# Patient Record
Sex: Female | Born: 1985 | Hispanic: Yes | Marital: Married | Smoking: Former smoker
Health system: Southern US, Community
[De-identification: ages and names within clinical notes are randomized; demographics above are authoritative.]

## PROBLEM LIST (undated history)

## (undated) DIAGNOSIS — J45909 Unspecified asthma, uncomplicated: Secondary | ICD-10-CM

## (undated) DIAGNOSIS — N83209 Unspecified ovarian cyst, unspecified side: Secondary | ICD-10-CM

## (undated) DIAGNOSIS — M722 Plantar fascial fibromatosis: Secondary | ICD-10-CM

## (undated) DIAGNOSIS — K8020 Calculus of gallbladder without cholecystitis without obstruction: Secondary | ICD-10-CM

## (undated) DIAGNOSIS — F419 Anxiety disorder, unspecified: Secondary | ICD-10-CM

## (undated) DIAGNOSIS — K819 Cholecystitis, unspecified: Secondary | ICD-10-CM

## (undated) DIAGNOSIS — Z9049 Acquired absence of other specified parts of digestive tract: Secondary | ICD-10-CM

## (undated) DIAGNOSIS — R87629 Unspecified abnormal cytological findings in specimens from vagina: Secondary | ICD-10-CM

## (undated) DIAGNOSIS — O24419 Gestational diabetes mellitus in pregnancy, unspecified control: Secondary | ICD-10-CM

## (undated) HISTORY — DX: Gestational diabetes mellitus in pregnancy, unspecified control: O24.419

## (undated) HISTORY — PX: CERVICAL BIOPSY  W/ LOOP ELECTRODE EXCISION: SUR135

## (undated) HISTORY — DX: Plantar fascial fibromatosis: M72.2

---

## 2014-07-10 DIAGNOSIS — K819 Cholecystitis, unspecified: Secondary | ICD-10-CM

## 2014-07-10 DIAGNOSIS — K8020 Calculus of gallbladder without cholecystitis without obstruction: Secondary | ICD-10-CM

## 2014-07-10 HISTORY — DX: Calculus of gallbladder without cholecystitis without obstruction: K80.20

## 2014-07-10 HISTORY — DX: Cholecystitis, unspecified: K81.9

## 2015-03-15 ENCOUNTER — Inpatient Hospital Stay (HOSPITAL_COMMUNITY)
Admission: EM | Admit: 2015-03-15 | Discharge: 2015-03-19 | DRG: 415 | Disposition: A | Discharge status: Home / Self Care | Payer: Self-pay | Attending: General Surgery | Admitting: General Surgery

## 2015-03-15 ENCOUNTER — Emergency Department (HOSPITAL_COMMUNITY): Payer: Self-pay

## 2015-03-15 ENCOUNTER — Encounter (HOSPITAL_COMMUNITY): Payer: Self-pay | Admitting: *Deleted

## 2015-03-15 DIAGNOSIS — Z419 Encounter for procedure for purposes other than remedying health state, unspecified: Secondary | ICD-10-CM

## 2015-03-15 DIAGNOSIS — Z79899 Other long term (current) drug therapy: Secondary | ICD-10-CM

## 2015-03-15 DIAGNOSIS — K8012 Calculus of gallbladder with acute and chronic cholecystitis without obstruction: Principal | ICD-10-CM | POA: Diagnosis present

## 2015-03-15 DIAGNOSIS — K8000 Calculus of gallbladder with acute cholecystitis without obstruction: Secondary | ICD-10-CM | POA: Diagnosis present

## 2015-03-15 DIAGNOSIS — Z7982 Long term (current) use of aspirin: Secondary | ICD-10-CM

## 2015-03-15 DIAGNOSIS — Y838 Other surgical procedures as the cause of abnormal reaction of the patient, or of later complication, without mention of misadventure at the time of the procedure: Secondary | ICD-10-CM | POA: Diagnosis not present

## 2015-03-15 DIAGNOSIS — K829 Disease of gallbladder, unspecified: Secondary | ICD-10-CM

## 2015-03-15 DIAGNOSIS — K913 Postprocedural intestinal obstruction: Secondary | ICD-10-CM | POA: Diagnosis not present

## 2015-03-15 DIAGNOSIS — K819 Cholecystitis, unspecified: Secondary | ICD-10-CM

## 2015-03-15 LAB — COMPREHENSIVE METABOLIC PANEL
ALBUMIN: 4.6 g/dL (ref 3.5–5.0)
ALK PHOS: 99 U/L (ref 38–126)
ALT: 22 U/L (ref 14–54)
ANION GAP: 8 (ref 5–15)
AST: 20 U/L (ref 15–41)
BILIRUBIN TOTAL: 0.8 mg/dL (ref 0.3–1.2)
BUN: 10 mg/dL (ref 6–20)
CALCIUM: 9.6 mg/dL (ref 8.9–10.3)
CO2: 26 mmol/L (ref 22–32)
Chloride: 107 mmol/L (ref 101–111)
Creatinine, Ser: 0.7 mg/dL (ref 0.44–1.00)
GLUCOSE: 115 mg/dL — AB (ref 65–99)
POTASSIUM: 3.5 mmol/L (ref 3.5–5.1)
Sodium: 141 mmol/L (ref 135–145)
TOTAL PROTEIN: 8.5 g/dL — AB (ref 6.5–8.1)

## 2015-03-15 LAB — URINALYSIS, ROUTINE W REFLEX MICROSCOPIC
Bilirubin Urine: NEGATIVE
Glucose, UA: NEGATIVE mg/dL
Hgb urine dipstick: NEGATIVE
Ketones, ur: NEGATIVE mg/dL
Nitrite: NEGATIVE
Protein, ur: NEGATIVE mg/dL
Specific Gravity, Urine: 1.028 (ref 1.005–1.030)
Urobilinogen, UA: 1 mg/dL (ref 0.0–1.0)
pH: 5.5 (ref 5.0–8.0)

## 2015-03-15 LAB — URINE MICROSCOPIC-ADD ON

## 2015-03-15 LAB — POC URINE PREG, ED: Preg Test, Ur: NEGATIVE

## 2015-03-15 LAB — CBC
HCT: 40.3 % (ref 36.0–46.0)
Hemoglobin: 13.6 g/dL (ref 12.0–15.0)
MCH: 29.5 pg (ref 26.0–34.0)
MCHC: 33.7 g/dL (ref 30.0–36.0)
MCV: 87.4 fL (ref 78.0–100.0)
Platelets: 341 K/uL (ref 150–400)
RBC: 4.61 MIL/uL (ref 3.87–5.11)
RDW: 12.3 % (ref 11.5–15.5)
WBC: 11.2 K/uL — ABNORMAL HIGH (ref 4.0–10.5)

## 2015-03-15 LAB — SURGICAL PCR SCREEN
MRSA, PCR: NEGATIVE
Staphylococcus aureus: NEGATIVE

## 2015-03-15 LAB — LIPASE, BLOOD: Lipase: 19 U/L — ABNORMAL LOW (ref 22–51)

## 2015-03-15 MED ORDER — HYDROMORPHONE HCL 1 MG/ML IJ SOLN
1.0000 mg | INTRAMUSCULAR | Status: DC | PRN
  Administered 2015-03-15 – 2015-03-16 (×5): 1 mg via INTRAVENOUS
  Filled 2015-03-15 (×5): qty 1

## 2015-03-15 MED ORDER — HYDROCODONE-ACETAMINOPHEN 5-325 MG PO TABS: 1.0000 | ORAL_TABLET | ORAL | Status: DC | PRN

## 2015-03-15 MED ORDER — ACETAMINOPHEN 325 MG PO TABS
650.0000 mg | ORAL_TABLET | Freq: Four times a day (QID) | ORAL | Status: DC | PRN
  Administered 2015-03-17: 650 mg via ORAL
  Filled 2015-03-15: qty 2

## 2015-03-15 MED ORDER — HYDROMORPHONE HCL 1 MG/ML IJ SOLN: 1.0000 mg | Freq: Once | INTRAMUSCULAR | Status: DC

## 2015-03-15 MED ORDER — PROCHLORPERAZINE EDISYLATE 5 MG/ML IJ SOLN
10.0000 mg | Freq: Once | INTRAMUSCULAR | Status: AC
  Administered 2015-03-15: 10 mg via INTRAVENOUS
  Filled 2015-03-15: qty 2

## 2015-03-15 MED ORDER — ACETAMINOPHEN 650 MG RE SUPP: 650.0000 mg | Freq: Four times a day (QID) | RECTAL | Status: DC | PRN

## 2015-03-15 MED ORDER — HYDROMORPHONE HCL 1 MG/ML IJ SOLN
0.5000 mg | Freq: Once | INTRAMUSCULAR | Status: AC
  Administered 2015-03-15: 0.5 mg via INTRAVENOUS
  Filled 2015-03-15: qty 1

## 2015-03-15 MED ORDER — ONDANSETRON HCL 4 MG/2ML IJ SOLN
4.0000 mg | Freq: Once | INTRAMUSCULAR | Status: AC
  Administered 2015-03-15: 4 mg via INTRAVENOUS
  Filled 2015-03-15: qty 2

## 2015-03-15 MED ORDER — ONDANSETRON HCL 4 MG/2ML IJ SOLN
4.0000 mg | Freq: Four times a day (QID) | INTRAMUSCULAR | Status: DC | PRN
  Administered 2015-03-16 – 2015-03-18 (×3): 4 mg via INTRAVENOUS
  Filled 2015-03-15 (×3): qty 2

## 2015-03-15 MED ORDER — KCL IN DEXTROSE-NACL 20-5-0.45 MEQ/L-%-% IV SOLN
INTRAVENOUS | Status: DC
  Administered 2015-03-15: 18:00:00 via INTRAVENOUS
  Administered 2015-03-16: 1000 mL via INTRAVENOUS
  Filled 2015-03-15 (×3): qty 1000

## 2015-03-15 MED ORDER — ONDANSETRON 4 MG PO TBDP
4.0000 mg | ORAL_TABLET | Freq: Four times a day (QID) | ORAL | Status: DC | PRN
  Administered 2015-03-19: 4 mg via ORAL
  Filled 2015-03-15: qty 1

## 2015-03-15 NOTE — ED Notes (Signed)
Pt reports her pcp told her she had gall stones 1 year ago, never got issue fixed. Pt started having abd pain,back pain, vomiting x3 days. Pain 8/10. Denies blood in  Urine or stool.

## 2015-03-15 NOTE — H&P (Signed)
Lori Donaldson is an 29 y.o. female.    General Surgery [facility]  Chief Complaint: abdominal pain, cholecystitis, cholelithiasis  HPI: patient is a 29 yo Hispanic female with two year hx of intermittent abdominal pain.  Diagnosed over one year ago with symptomatic cholelithiasis.  Seen by surgery but unable to proceed due to financial concerns.  Continued intermittent pain approx twice per month.  Now with onset pain 48 hours ago which unrelenting after eating pizza and steak.  Nausea and emesis and dry heaves.  No fever or chills.  Denies jaundice or acholic stools.  No prior adominal surgery.  History reviewed. No pertinent past medical history.  History reviewed. No pertinent past surgical history.  History reviewed. No pertinent family history. Social History:  reports that she has never smoked. She does not have any smokeless tobacco history on file. She reports that she does not drink alcohol or use illicit drugs.  Allergies: No Known Allergies   (Not in a hospital admission)  Results for orders placed or performed during the hospital encounter of 03/15/15 (from the past 48 hour(s))  Lipase, blood     Status: Abnormal   Collection Time: 03/15/15 11:46 AM  Result Value Ref Range   Lipase 19 (L) 22 - 51 U/L  Comprehensive metabolic panel     Status: Abnormal   Collection Time: 03/15/15 11:46 AM  Result Value Ref Range   Sodium 141 135 - 145 mmol/L   Potassium 3.5 3.5 - 5.1 mmol/L   Chloride 107 101 - 111 mmol/L   CO2 26 22 - 32 mmol/L   Glucose, Bld 115 (H) 65 - 99 mg/dL   BUN 10 6 - 20 mg/dL   Creatinine, Ser 8.62 0.44 - 1.00 mg/dL   Calcium 9.6 8.9 - 76.5 mg/dL   Total Protein 8.5 (H) 6.5 - 8.1 g/dL   Albumin 4.6 3.5 - 5.0 g/dL   AST 20 15 - 41 U/L   ALT 22 14 - 54 U/L   Alkaline Phosphatase 99 38 - 126 U/L   Total Bilirubin 0.8 0.3 - 1.2 mg/dL   GFR calc non Af Amer >60 >60 mL/min   GFR calc Af Amer >60 >60 mL/min    Comment: (NOTE) The  eGFR has been calculated using the CKD EPI equation. This calculation has not been validated in all clinical situations. eGFR's persistently <60 mL/min signify possible Chronic Kidney Disease.    Anion gap 8 5 - 15  CBC     Status: Abnormal   Collection Time: 03/15/15 11:46 AM  Result Value Ref Range   WBC 11.2 (H) 4.0 - 10.5 K/uL   RBC 4.61 3.87 - 5.11 MIL/uL   Hemoglobin 13.6 12.0 - 15.0 g/dL   HCT 99.4 20.2 - 72.4 %   MCV 87.4 78.0 - 100.0 fL   MCH 29.5 26.0 - 34.0 pg   MCHC 33.7 30.0 - 36.0 g/dL   RDW 15.0 66.2 - 86.3 %   Platelets 341 150 - 400 K/uL  Urinalysis, Routine w reflex microscopic (not at [facility])     Status: Abnormal   Collection Time: 03/15/15 12:00 PM  Result Value Ref Range   Color, Urine AMBER (A) YELLOW    Comment: BIOCHEMICALS MAY BE AFFECTED BY COLOR   APPearance CLOUDY (A) CLEAR   Specific Gravity, Urine 1.028 1.005 - 1.030   pH 5.5 5.0 - 8.0   Glucose, UA NEGATIVE NEGATIVE mg/dL   Hgb urine dipstick NEGATIVE NEGATIVE  Bilirubin Urine NEGATIVE NEGATIVE   Ketones, ur NEGATIVE NEGATIVE mg/dL   Protein, ur NEGATIVE NEGATIVE mg/dL   Urobilinogen, UA 1.0 0.0 - 1.0 mg/dL   Nitrite NEGATIVE NEGATIVE   Leukocytes, UA SMALL (A) NEGATIVE  Urine microscopic-add on     Status: Abnormal   Collection Time: 03/15/15 12:00 PM  Result Value Ref Range   Squamous Epithelial / LPF MANY (A) RARE   WBC, UA 3-6 <3 WBC/hpf   RBC / HPF 0-2 <3 RBC/hpf   Bacteria, UA MANY (A) RARE  POC urine preg, ED (not at [facility])     Status: None   Collection Time: 03/15/15 12:03 PM  Result Value Ref Range   Preg Test, Ur NEGATIVE NEGATIVE    Comment:        THE SENSITIVITY OF THIS METHODOLOGY IS >24 mIU/mL    US Abdomen Complete  03/15/2015   CLINICAL DATA:  Right upper quadrant pain.  EXAM: ULTRASOUND ABDOMEN COMPLETE  COMPARISON:  None.  FINDINGS: Gallbladder: Cholelithiasis with mild gallbladder wall thickening. No pericholecystic fluid. Negative sonographic Murphy sign.  Common bile  duct: Diameter: 1.7 mm  Liver: No focal lesion identified. Within normal limits in parenchymal echogenicity.  IVC: No abnormality visualized.  Pancreas: Limited visualization secondary to overlying bowel gas.  Spleen: Size and appearance within normal limits.  Right Kidney: Length: 11.8 cm. Echogenicity within normal limits. No mass or hydronephrosis visualized.  Left Kidney: Length: 11.1 cm. Echogenicity within normal limits. No mass or hydronephrosis visualized.  Abdominal aorta: No aneurysm visualized.  Other findings: None.  IMPRESSION: Cholelithiasis with mild gallbladder wall thickening. The findings are concerning for, but not diagnostic of acute cholecystitis.   Electronically Signed   By: Kathreen Devoid   On: 03/15/2015 16:17    Review of Systems  Constitutional: Negative for fever, chills and diaphoresis.  HENT: Negative.   Eyes: Negative.   Respiratory: Negative.   Cardiovascular: Negative.   Gastrointestinal: Positive for nausea, vomiting and abdominal pain. Negative for diarrhea and constipation.  Genitourinary: Negative.   Musculoskeletal: Positive for back pain.  Skin: Negative.   Neurological: Negative.   Endo/Heme/Allergies: Negative.   Psychiatric/Behavioral: Negative.     Blood pressure 145/90, pulse 68, temperature 98 F (36.7 C), temperature source Oral, resp. rate 18, last menstrual period 02/10/2015, SpO2 99 %. Physical Exam  Constitutional: She is oriented to person, place, and time. She appears well-developed and well-nourished. No distress.  HENT:  Head: Normocephalic and atraumatic.  Right Ear: External ear normal.  Left Ear: External ear normal.  Mouth/Throat: No oropharyngeal exudate.  Eyes: Conjunctivae are normal. Pupils are equal, round, and reactive to light. No scleral icterus.  Neck: Normal range of motion. Neck supple. No tracheal deviation present. No thyromegaly present.  Cardiovascular: Normal rate, regular rhythm and normal heart sounds.   No murmur  heard. Respiratory: Effort normal and breath sounds normal. No respiratory distress. She has no wheezes.  GI: Soft. Bowel sounds are normal. She exhibits no distension and no mass. There is tenderness (mild RUQ). There is no rebound and no guarding.  Musculoskeletal: Normal range of motion. She exhibits no edema.  Neurological: She is alert and oriented to person, place, and time.  Skin: Skin is warm and dry. She is not diaphoretic.  Psychiatric: She has a normal mood and affect. Her behavior is normal.     Assessment/Plan  Subacute cholecystitis, cholelithiasis, unrelenting biliary colic  Admit to general surgery service  IV hydration  Pain control  Rx  nausea  Plan OR (lap chole with IOC) in AM 9/6 by Dr. Fanny Skates  The risks and benefits of the procedure have been discussed at length with the patient.  The patient understands the proposed procedure, potential alternative treatments, and the course of recovery to be expected.  All of the patient's questions have been answered at this time.  The patient wishes to proceed with surgery.  Earnstine Regal, MD, [facility] Office: [place] 03/15/2015, 5:44 PM

## 2015-03-15 NOTE — ED Provider Notes (Signed)
CSN: [id number]     Arrival date & time 03/15/15  1129 History   First MD Initiated Contact with Patient 03/15/15 1435     Chief Complaint  Patient presents with  . Abdominal Pain  . Emesis     (Consider location/radiation/quality/duration/timing/severity/associated sxs/prior Treatment) HPI   Lori Donaldson is a 29 y.o. female presents for evaluation of right upper quadrant pain radiating to her right back, present and constant for 3 days. She has nausea and vomiting. No diarrhea. Prior diagnosis of gallstones, soft, surgeon, for same, 1 year ago, decided not to proceed with removal at that time. No fever, cough or chest pain. No meds tried. There are no other known modifying factors.      History reviewed. No pertinent past medical history. History reviewed. No pertinent past surgical history. History reviewed. No pertinent family history. Social History  Substance Use Topics  . Smoking status: Never Smoker   . Smokeless tobacco: None  . Alcohol Use: No   OB History    No data available     Review of Systems  All other systems reviewed and are negative.     Allergies  Review of patient's allergies indicates no known allergies.  Home Medications   Prior to Admission medications   Medication Sig Start Date End Date Taking? Authorizing Provider  aspirin-acetaminophen-caffeine (EXCEDRIN MIGRAINE) [id number] MG per tablet Take 2 tablets by mouth every 6 (six) hours as needed for headache.   Yes Historical Provider, MD  ibuprofen (ADVIL,MOTRIN) 200 MG tablet Take 400-800 mg by mouth every 6 (six) hours as needed for headache or cramping.   Yes Historical Provider, MD  Menthol, Topical Analgesic, (ICY HOT BACK) 5 % PADS Apply 1 patch topically daily as needed (back pain).   Yes Historical Provider, MD  Menthol-Camphor (TIGER BALM ARTHRITIS RUB EX) Apply 1 application topically 2 (two) times daily as needed (muscle pain).   Yes Historical Provider, MD   BP 126/81 mmHg  Pulse  69  Temp(Src) 98 F (36.7 C) (Oral)  Resp 18  SpO2 98%  LMP 02/10/2015 (Approximate) Physical Exam  Constitutional: She is oriented to person, place, and time. She appears well-developed and well-nourished.  HENT:  Head: Normocephalic and atraumatic.  Right Ear: External ear normal.  Left Ear: External ear normal.  Eyes: Conjunctivae and EOM are normal. Pupils are equal, round, and reactive to light.  Neck: Normal range of motion and phonation normal. Neck supple.  Cardiovascular: Normal rate, regular rhythm and normal heart sounds.   Pulmonary/Chest: Effort normal and breath sounds normal. No respiratory distress. She exhibits no bony tenderness.  Abdominal: Soft. There is tenderness (right upper quadrant, mild.. This examination was performed shortly after she received IV hydromorphone.). There is no rebound.  Genitourinary:  No cost vertebral angle tenderness  Musculoskeletal: Normal range of motion.  Neurological: She is alert and oriented to person, place, and time. No cranial nerve deficit or sensory deficit. She exhibits normal muscle tone. Coordination normal.  Skin: Skin is warm, dry and intact.  Psychiatric: She has a normal mood and affect. Her behavior is normal. Judgment and thought content normal.  Nursing note and vitals reviewed.   ED Course  Procedures (including critical care time)  Medications  HYDROmorphone (DILAUDID) injection 0.5 mg (0.5 mg Intravenous Given 03/15/15 1410)  ondansetron (ZOFRAN) injection 4 mg (4 mg Intravenous Given 03/15/15 1410)    Patient Vitals for the past 24 hrs:  BP Temp Temp src Pulse Resp SpO2  03/15/15 1409  126/81 mmHg - - 69 18 98 %  03/15/15 1136 130/95 mmHg 98 F (36.7 C) Oral 78 16 97 %    4:30 PM Reevaluation with update and discussion. After initial assessment and treatment, an updated evaluation reveals she states that her pain is "coming back" and she appears uncomfortable. Additional medication ordered and surgery will be  contacted for admission. [name]   16:30- consult to Gen. Surgery; Dr. Gerrit Friends will admit  Labs Review Labs Reviewed  LIPASE, BLOOD - Abnormal; Notable for the following:    Lipase 19 (*)    All other components within normal limits  COMPREHENSIVE METABOLIC PANEL - Abnormal; Notable for the following:    Glucose, Bld 115 (*)    Total Protein 8.5 (*)    All other components within normal limits  CBC - Abnormal; Notable for the following:    WBC 11.2 (*)    All other components within normal limits  URINALYSIS, ROUTINE W REFLEX MICROSCOPIC (NOT AT [facility]) - Abnormal; Notable for the following:    Color, Urine AMBER (*)    APPearance CLOUDY (*)    Leukocytes, UA SMALL (*)    All other components within normal limits  URINE MICROSCOPIC-ADD ON - Abnormal; Notable for the following:    Squamous Epithelial / LPF MANY (*)    Bacteria, UA MANY (*)    All other components within normal limits  POC URINE PREG, ED    Imaging Review No results found. I have personally reviewed and evaluated these images and lab results as part of my medical decision-making.   EKG Interpretation None      MDM   Final diagnoses:  Gallbladder disease  Cholecystitis    Gallstones, symptomatic, with signs for cholecystitis, mildly elevated white count and gallbladder wall thickening. No fever currently. Patient remains comfortable after treatment, and will require surgical evaluation in the emergency department.  Nursing Notes Reviewed/ Care Coordinated, and agree without changes. Applicable Imaging Reviewed.  Interpretation of Laboratory Data incorporated into ED treatment  Plan: Admit    Mancel Bale, MD 03/15/15 [phone]

## 2015-03-16 ENCOUNTER — Encounter (HOSPITAL_COMMUNITY): Admission: EM | Disposition: A | Discharge status: Home / Self Care | Payer: Self-pay | Source: Home / Self Care

## 2015-03-16 ENCOUNTER — Observation Stay (HOSPITAL_COMMUNITY): Payer: MEDICAID | Admitting: Anesthesiology

## 2015-03-16 ENCOUNTER — Observation Stay (HOSPITAL_COMMUNITY): Payer: Self-pay | Admitting: Anesthesiology

## 2015-03-16 ENCOUNTER — Observation Stay (HOSPITAL_COMMUNITY): Payer: MEDICAID

## 2015-03-16 ENCOUNTER — Encounter (HOSPITAL_COMMUNITY): Payer: Self-pay

## 2015-03-16 DIAGNOSIS — K8000 Calculus of gallbladder with acute cholecystitis without obstruction: Secondary | ICD-10-CM | POA: Diagnosis present

## 2015-03-16 DIAGNOSIS — Z9049 Acquired absence of other specified parts of digestive tract: Secondary | ICD-10-CM

## 2015-03-16 HISTORY — PX: CHOLECYSTECTOMY: SHX55

## 2015-03-16 HISTORY — DX: Acquired absence of other specified parts of digestive tract: Z90.49

## 2015-03-16 LAB — CBC
HEMATOCRIT: 36.5 % (ref 36.0–46.0)
Hemoglobin: 12 g/dL (ref 12.0–15.0)
MCH: 29.3 pg (ref 26.0–34.0)
MCHC: 32.9 g/dL (ref 30.0–36.0)
MCV: 89 fL (ref 78.0–100.0)
Platelets: 297 10*3/uL (ref 150–400)
RBC: 4.1 MIL/uL (ref 3.87–5.11)
RDW: 12.5 % (ref 11.5–15.5)
WBC: 20.7 10*3/uL — ABNORMAL HIGH (ref 4.0–10.5)

## 2015-03-16 LAB — CREATININE, SERUM
Creatinine, Ser: 0.62 mg/dL (ref 0.44–1.00)
GFR calc Af Amer: >60 mL/min (ref >60)

## 2015-03-16 SURGERY — LAPAROSCOPIC CHOLECYSTECTOMY WITH INTRAOPERATIVE CHOLANGIOGRAM
Anesthesia: General

## 2015-03-16 MED ORDER — FENTANYL CITRATE (PF) 100 MCG/2ML IJ SOLN
INTRAMUSCULAR | Status: DC | PRN
  Administered 2015-03-16 (×3): 100 ug via INTRAVENOUS
  Administered 2015-03-16 (×3): 50 ug via INTRAVENOUS
  Administered 2015-03-16: 25 ug via INTRAVENOUS

## 2015-03-16 MED ORDER — DEXTROSE 5 % IV SOLN
INTRAVENOUS | Status: AC
  Filled 2015-03-16: qty 2

## 2015-03-16 MED ORDER — OXYCODONE-ACETAMINOPHEN 5-325 MG PO TABS
1.0000 | ORAL_TABLET | ORAL | Status: DC | PRN
  Administered 2015-03-17 – 2015-03-19 (×8): 2 via ORAL
  Filled 2015-03-16 (×8): qty 2

## 2015-03-16 MED ORDER — FENTANYL CITRATE (PF) 100 MCG/2ML IJ SOLN
25.0000 ug | INTRAMUSCULAR | Status: DC | PRN
  Administered 2015-03-16 (×2): 50 ug via INTRAVENOUS

## 2015-03-16 MED ORDER — CEFAZOLIN SODIUM-DEXTROSE 2-3 GM-% IV SOLR
INTRAVENOUS | Status: AC
  Filled 2015-03-16: qty 50

## 2015-03-16 MED ORDER — PROMETHAZINE HCL 25 MG/ML IJ SOLN: 6.2500 mg | INTRAMUSCULAR | Status: DC | PRN

## 2015-03-16 MED ORDER — CEFAZOLIN SODIUM-DEXTROSE 2-3 GM-% IV SOLR
2.0000 g | Freq: Three times a day (TID) | INTRAVENOUS | Status: DC
  Administered 2015-03-16: 2 g via INTRAVENOUS
  Filled 2015-03-16 (×2): qty 50

## 2015-03-16 MED ORDER — PROPOFOL 10 MG/ML IV BOLUS
INTRAVENOUS | Status: AC
  Filled 2015-03-16: qty 20

## 2015-03-16 MED ORDER — LIDOCAINE HCL (CARDIAC) 20 MG/ML IV SOLN
INTRAVENOUS | Status: DC | PRN
  Administered 2015-03-16: 75 mg via INTRAVENOUS

## 2015-03-16 MED ORDER — ROCURONIUM BROMIDE 100 MG/10ML IV SOLN
INTRAVENOUS | Status: DC | PRN
  Administered 2015-03-16: 10 mg via INTRAVENOUS
  Administered 2015-03-16: 40 mg via INTRAVENOUS
  Administered 2015-03-16 (×2): 10 mg via INTRAVENOUS

## 2015-03-16 MED ORDER — PANTOPRAZOLE SODIUM 40 MG IV SOLR
40.0000 mg | Freq: Every day | INTRAVENOUS | Status: DC
  Administered 2015-03-16: 40 mg via INTRAVENOUS
  Filled 2015-03-16 (×2): qty 40

## 2015-03-16 MED ORDER — MIDAZOLAM HCL 5 MG/5ML IJ SOLN
INTRAMUSCULAR | Status: DC | PRN
  Administered 2015-03-16 (×2): 1 mg via INTRAVENOUS

## 2015-03-16 MED ORDER — FENTANYL CITRATE (PF) 100 MCG/2ML IJ SOLN
INTRAMUSCULAR | Status: AC
  Filled 2015-03-16: qty 2

## 2015-03-16 MED ORDER — GLYCOPYRROLATE 0.2 MG/ML IJ SOLN
INTRAMUSCULAR | Status: DC | PRN
  Administered 2015-03-16: .6 mg via INTRAVENOUS

## 2015-03-16 MED ORDER — BUPIVACAINE-EPINEPHRINE 0.5% -1:200000 IJ SOLN
INTRAMUSCULAR | Status: DC | PRN
  Administered 2015-03-16: 10 mL

## 2015-03-16 MED ORDER — ONDANSETRON 4 MG PO TBDP: 4.0000 mg | ORAL_TABLET | Freq: Four times a day (QID) | ORAL | Status: DC | PRN

## 2015-03-16 MED ORDER — HYDROMORPHONE HCL 1 MG/ML IJ SOLN
1.0000 mg | INTRAMUSCULAR | Status: DC | PRN
  Administered 2015-03-16 – 2015-03-17 (×3): 1 mg via INTRAVENOUS
  Filled 2015-03-16 (×3): qty 1

## 2015-03-16 MED ORDER — MIDAZOLAM HCL 2 MG/2ML IJ SOLN
INTRAMUSCULAR | Status: AC
  Filled 2015-03-16: qty 4

## 2015-03-16 MED ORDER — FENTANYL CITRATE (PF) 100 MCG/2ML IJ SOLN
INTRAMUSCULAR | Status: AC
  Filled 2015-03-16: qty 4

## 2015-03-16 MED ORDER — MEPERIDINE HCL 50 MG/ML IJ SOLN: 6.2500 mg | INTRAMUSCULAR | Status: DC | PRN

## 2015-03-16 MED ORDER — NEOSTIGMINE METHYLSULFATE 10 MG/10ML IV SOLN
INTRAVENOUS | Status: DC | PRN
  Administered 2015-03-16: 5 mg via INTRAVENOUS

## 2015-03-16 MED ORDER — BUPIVACAINE-EPINEPHRINE 0.5% -1:200000 IJ SOLN
INTRAMUSCULAR | Status: AC
  Filled 2015-03-16: qty 1

## 2015-03-16 MED ORDER — ACETAMINOPHEN 10 MG/ML IV SOLN
INTRAVENOUS | Status: AC
  Filled 2015-03-16: qty 100

## 2015-03-16 MED ORDER — DEXTROSE 5 % IV SOLN
2.0000 g | INTRAVENOUS | Status: DC
  Administered 2015-03-16 – 2015-03-18 (×3): 2 g via INTRAVENOUS
  Filled 2015-03-16 (×3): qty 2

## 2015-03-16 MED ORDER — FENTANYL CITRATE (PF) 100 MCG/2ML IJ SOLN
25.0000 ug | Freq: Once | INTRAMUSCULAR | Status: AC
  Administered 2015-03-16: 25 ug via INTRAVENOUS

## 2015-03-16 MED ORDER — ENOXAPARIN SODIUM 40 MG/0.4ML ~~LOC~~ SOLN
40.0000 mg | SUBCUTANEOUS | Status: DC
  Administered 2015-03-17 – 2015-03-19 (×3): 40 mg via SUBCUTANEOUS
  Filled 2015-03-16 (×5): qty 0.4

## 2015-03-16 MED ORDER — ACETAMINOPHEN 10 MG/ML IV SOLN
1000.0000 mg | Freq: Once | INTRAVENOUS | Status: AC
  Administered 2015-03-16: 1000 mg via INTRAVENOUS

## 2015-03-16 MED ORDER — LACTATED RINGERS IV SOLN: INTRAVENOUS | Status: DC

## 2015-03-16 MED ORDER — FENTANYL CITRATE (PF) 100 MCG/2ML IJ SOLN
50.0000 ug | Freq: Once | INTRAMUSCULAR | Status: AC
  Administered 2015-03-16: 50 ug via INTRAVENOUS

## 2015-03-16 MED ORDER — DEXTROSE 5 % IV SOLN
1.0000 g | Freq: Once | INTRAVENOUS | Status: AC
  Administered 2015-03-16: 2 g via INTRAVENOUS
  Filled 2015-03-16: qty 10

## 2015-03-16 MED ORDER — ONDANSETRON HCL 4 MG/2ML IJ SOLN: 4.0000 mg | Freq: Four times a day (QID) | INTRAMUSCULAR | Status: DC | PRN

## 2015-03-16 MED ORDER — POTASSIUM CHLORIDE IN NACL 20-0.9 MEQ/L-% IV SOLN
INTRAVENOUS | Status: DC
  Administered 2015-03-16 – 2015-03-18 (×6): via INTRAVENOUS
  Administered 2015-03-19: 50 mL/h via INTRAVENOUS
  Filled 2015-03-16 (×9): qty 1000

## 2015-03-16 MED ORDER — DEXAMETHASONE SODIUM PHOSPHATE 10 MG/ML IJ SOLN
INTRAMUSCULAR | Status: DC | PRN
  Administered 2015-03-16: 10 mg via INTRAVENOUS

## 2015-03-16 MED ORDER — ONDANSETRON HCL 4 MG/2ML IJ SOLN
INTRAMUSCULAR | Status: DC | PRN
  Administered 2015-03-16: 4 mg via INTRAVENOUS

## 2015-03-16 MED ORDER — SUCCINYLCHOLINE CHLORIDE 20 MG/ML IJ SOLN
INTRAMUSCULAR | Status: DC | PRN
  Administered 2015-03-16: 120 mg via INTRAVENOUS

## 2015-03-16 MED ORDER — LACTATED RINGERS IV SOLN
INTRAVENOUS | Status: DC
  Administered 2015-03-16 (×2): via INTRAVENOUS
  Administered 2015-03-16: 1000 mL via INTRAVENOUS

## 2015-03-16 MED ORDER — CHLORHEXIDINE GLUCONATE 0.12 % MT SOLN
15.0000 mL | Freq: Two times a day (BID) | OROMUCOSAL | Status: DC
  Administered 2015-03-16 – 2015-03-18 (×5): 15 mL via OROMUCOSAL
  Filled 2015-03-16 (×7): qty 15

## 2015-03-16 MED ORDER — HYDROMORPHONE HCL 1 MG/ML IJ SOLN
INTRAMUSCULAR | Status: DC | PRN
  Administered 2015-03-16 (×4): 0.5 mg via INTRAVENOUS

## 2015-03-16 MED ORDER — PROPOFOL 10 MG/ML IV BOLUS
INTRAVENOUS | Status: DC | PRN
  Administered 2015-03-16: 175 mg via INTRAVENOUS

## 2015-03-16 MED ORDER — FENTANYL CITRATE (PF) 250 MCG/5ML IJ SOLN
INTRAMUSCULAR | Status: AC
  Filled 2015-03-16: qty 25

## 2015-03-16 SURGICAL SUPPLY — 40 items
APPLIER CLIP ROT 10 11.4 M/L (STAPLE) ×3
BENZOIN TINCTURE PRP APPL 2/3 (GAUZE/BANDAGES/DRESSINGS) IMPLANT
CLIP APPLIE ROT 10 11.4 M/L (STAPLE) ×1 IMPLANT
CLOSURE WOUND 1/2 X4 (GAUZE/BANDAGES/DRESSINGS)
COVER MAYO STAND STRL (DRAPES) ×3 IMPLANT
COVER SURGICAL LIGHT HANDLE (MISCELLANEOUS) ×3 IMPLANT
DECANTER SPIKE VIAL GLASS SM (MISCELLANEOUS) ×3 IMPLANT
DRAPE C-ARM 42X120 X-RAY (DRAPES) ×3 IMPLANT
DRAPE LAPAROSCOPIC ABDOMINAL (DRAPES) ×3 IMPLANT
DRSG TELFA 3X8 NADH (GAUZE/BANDAGES/DRESSINGS) ×3 IMPLANT
ELECT PENCIL ROCKER SW 15FT (MISCELLANEOUS) ×3 IMPLANT
ELECT REM PT RETURN 9FT ADLT (ELECTROSURGICAL) ×3
ELECTRODE REM PT RTRN 9FT ADLT (ELECTROSURGICAL) ×1 IMPLANT
ENDOLOOP SUT PDS II  0 18 (SUTURE) ×4
ENDOLOOP SUT PDS II 0 18 (SUTURE) ×2 IMPLANT
GAUZE SPONGE 4X4 12PLY STRL (GAUZE/BANDAGES/DRESSINGS) ×6 IMPLANT
GLOVE EUDERMIC 7 POWDERFREE (GLOVE) ×3 IMPLANT
GOWN STRL REUS W/TWL XL LVL3 (GOWN DISPOSABLE) ×12 IMPLANT
HEMOSTAT SNOW SURGICEL 2X4 (HEMOSTASIS) IMPLANT
KIT BASIN OR (CUSTOM PROCEDURE TRAY) ×3 IMPLANT
LIQUID BAND (GAUZE/BANDAGES/DRESSINGS) ×3 IMPLANT
POUCH SPECIMEN RETRIEVAL 10MM (ENDOMECHANICALS) ×3 IMPLANT
SCISSORS LAP 5X35 DISP (ENDOMECHANICALS) ×3 IMPLANT
SET CHOLANGIOGRAPH MIX (MISCELLANEOUS) ×3 IMPLANT
SET IRRIG TUBING LAPAROSCOPIC (IRRIGATION / IRRIGATOR) ×3 IMPLANT
SLEEVE XCEL OPT CAN 5 100 (ENDOMECHANICALS) ×3 IMPLANT
SPONGE DRAIN TRACH 4X4 STRL 2S (GAUZE/BANDAGES/DRESSINGS) ×3 IMPLANT
STRIP CLOSURE SKIN 1/2X4 (GAUZE/BANDAGES/DRESSINGS) IMPLANT
SUT ETHILON 3 0 PS 1 (SUTURE) ×3 IMPLANT
SUT MNCRL AB 4-0 PS2 18 (SUTURE) ×3 IMPLANT
SUT PDS AB 1 CTX 36 (SUTURE) ×12 IMPLANT
SYR BULB IRRIGATION 50ML (SYRINGE) ×3 IMPLANT
TAPE CLOTH SURG 4X10 WHT LF (GAUZE/BANDAGES/DRESSINGS) ×6 IMPLANT
TOWEL OR 17X26 10 PK STRL BLUE (TOWEL DISPOSABLE) ×3 IMPLANT
TOWEL OR NON WOVEN STRL DISP B (DISPOSABLE) ×3 IMPLANT
TRAY LAPAROSCOPIC (CUSTOM PROCEDURE TRAY) ×3 IMPLANT
TROCAR BLADELESS OPT 5 100 (ENDOMECHANICALS) ×3 IMPLANT
TROCAR XCEL BLUNT TIP 100MML (ENDOMECHANICALS) ×3 IMPLANT
TROCAR XCEL NON-BLD 11X100MML (ENDOMECHANICALS) ×3 IMPLANT
YANKAUER SUCT BULB TIP 10FT TU (MISCELLANEOUS) ×3 IMPLANT

## 2015-03-16 NOTE — H&P (View-Only) (Signed)
  Subjective: Stable.  Awake.  Alert.  Pleasant and cooperative. Says right upper quadrant and right flank pain persist.  Still nauseated. Afebrile.  Heart rate 76.  BP 129/75. Lab work yesterday reveals WBC 11,200, LFTs normal, lipase normal. Ultrasound confirms multiple gallstones, borderline all bladder wall thickening.  CBD 1.7 mm.  Liver healthy.  Objective: Vital signs in last 24 hours: Temp:  [98 F (36.7 C)-98.6 F (37 C)] 98.6 F (37 C) (09/06 0133) Pulse Rate:  [64-78] 76 (09/06 0133) Resp:  [16-18] 17 (09/06 0133) BP: (109-145)/(58-95) 129/75 mmHg (09/06 0133) SpO2:  [96 %-100 %] 96 % (09/06 0133) Weight:  [81.647 kg (180 lb)] 81.647 kg (180 lb) (09/05 2100) Last BM Date: 03/15/15  Intake/Output from previous day: 09/05 0701 - 09/06 0700 In: 873.3 [P.O.:120; I.V.:753.3] Out: 150 [Urine:150] Intake/Output this shift: Total I/O In: 873.3 [P.O.:120; I.V.:753.3] Out: 150 [Urine:150]    EXAM: General appearance: Pleasant.  Does not appear to be in any distress.  Cooperative GI: Tender right upper quadrant.  No mass.  Not distended.  No scars or hernias.  Jewelry at umbilicus which will need to be removed.  Lab Results:   Recent Labs  03/15/15 1146  WBC 11.2*  HGB 13.6  HCT 40.3  PLT 341   BMET  Recent Labs  03/15/15 1146  NA 141  K 3.5  CL 107  CO2 26  GLUCOSE 115*  BUN 10  CREATININE 0.70  CALCIUM 9.6   PT/INR No results for input(s): LABPROT, INR in the last 72 hours. ABG No results for input(s): PHART, HCO3 in the last 72 hours.  Invalid input(s): PCO2, PO2  Studies/Results: Us Abdomen Complete  03/15/2015   CLINICAL DATA:  Right upper quadrant pain.  EXAM: ULTRASOUND ABDOMEN COMPLETE  COMPARISON:  None.  FINDINGS: Gallbladder: Cholelithiasis with mild gallbladder wall thickening. No pericholecystic fluid. Negative sonographic Murphy sign.  Common bile duct: Diameter: 1.7 mm  Liver: No focal lesion identified. Within normal limits in  parenchymal echogenicity.  IVC: No abnormality visualized.  Pancreas: Limited visualization secondary to overlying bowel gas.  Spleen: Size and appearance within normal limits.  Right Kidney: Length: 11.8 cm. Echogenicity within normal limits. No mass or hydronephrosis visualized.  Left Kidney: Length: 11.1 cm. Echogenicity within normal limits. No mass or hydronephrosis visualized.  Abdominal aorta: No aneurysm visualized.  Other findings: None.  IMPRESSION: Cholelithiasis with mild gallbladder wall thickening. The findings are concerning for, but not diagnostic of acute cholecystitis.   Electronically Signed   By: Hetal  Patel   On: 03/15/2015 16:17    Anti-infectives: Anti-infectives    None      Assessment/Plan:  Acute and chronic cholecystitis with cholelithiasis. I discussed cholecystectomy with the patient and she desires to proceed with that surgery today. Continue nothing by mouth and IV fluid hydration, pain control and nausea control.  I discussed the indications, details, techniques, and numerous risk of laparoscopic cholecystectomy with glandular gram, possible open cholecystectomy.  She's aware of the risk of bleeding, infection, conversion to open laparotomy, bile leak, wound hernia, injury to adjacent organs such as the intestine or bile duct with major reconstructive surgery.  She understands all these issues and all of her questions are answered.  She agrees with this plan.  Preop orders are written.      Timoty Bourke M 03/16/2015      

## 2015-03-16 NOTE — Op Note (Signed)
Patient Name:           Lori Donaldson   Date of Surgery:        03/16/2015  Pre op Diagnosis:      Acute cholecystitis with cholelithiasis  Post op Diagnosis:    Same  Procedure:                 Laparoscopic, converted to open cholecystectomy  Surgeon:                     Angelia Mould. Derrell Lolling, M.D., FACS  Assistant:                      Gaynelle Adu, MD and Dillard Essex, M.D.  Operative Indications:   This is a 29 year old Hispanic female with a two-year history of intermittent episodes of right upper quadrant and right flank pain.  She declined surgery in the past.  She's had 2 episodes the last month.  She presented with 48-hour history of unrelenting pain after eating pizza with nausea and emesis.  Liver function test are normal.  White blood cell count was elevated to 11,000.  On exam she has tenderness and guarding in the right upper quadrant.  She was brought to the operating room this morning following admission yesterday by Dr. Gerrit Friends.  Operative Findings:       The gallbladder was extremely inflamed, very thick walled, very edematous.  The duodenum and soft tissues were riding up and complete used during the infundibulum.  I was able to take the dissection down somewhat but during the dissection into the infundibulum and lots of stones spilled out.  I felt the tissues were too friable to handle laparoscopically and converted to an open procedure.  The open procedure was a complete cholecystectomy, with the dissection from the fundus all the way down to the distal cystic duct.  She had several stones in the cystic duct which we milked retrograde and removed.  The liver appeared healthy.  We felt that all stones were removed, and we irrigated and examined all of the operative spaces extensively to be sure that we did not leave any stones behind.  I left a drain in case there was a bile leak, although I do not anticipate that.  Procedure in Detail:         Following the induction of general  endotracheal anesthesia, intravenous antibiotic were given, surgical timeout was performed, 0.5% Marcaine with epinephrine was used as local infiltration as stated.  An 11 mm Hassan trocar was inserted in the infraumbilical site with open technique and secured with the Purstring suture of 0 Vicryl.  Pneumoperitoneum was carried.  Video camera was inserted.  Trochars placed in subxiphoid region and two 5 mm trochars placed in the right upper quadrant.  Suction trocar was used to evacuate bile from the gallbladder but only partially emptied due to numerous stones in the gallbladder.  We're able to lift the gallbladder up somewhat.  We did  find one small  artery going onto the gallbladder which was clipped and divided.  As we were taking the dissection down from the right and the left we entered the infundibulum and some stones came out.  These were retrieved.  Felt that the tissues were too fragile to dissected laparoscopically and I elected to convert to an open operation.       All of the trochars were removed.  The fascia at the umbilicus was closed with  interrupted sutures of 0 Vicryl and Purstring suture of 0 Vicryl.  Ultimately close the umbilical incision with skin staples.     A right subcostal incision was made.  Abdominal wall wall muscles were divided with cautery and the abdomen entered under direct vision.  Self-retaining retractor was placed.  There were no stones visible.  I incised the gallbladder serosa at the tip of the fundus and then slowly dissected it from the top down taking it out of the liver bed.  We will get the dissection down to we could identify the opening in the distal infundibulum.  We continued our dissection sectioning got below this and found that we had about 3 cm the infundibulum below the opening to evaluate down to the cystic duct.  We amputated the gallbladder at the level of the distal infundibulum and removed it from the operative field.  We milked a few remaining stones  out of the cystic duct and divided the cystic duct leaving about a 2-1/2 cm length.  I then closed the cystic duct with 2 PDS Endoloops and this looked good.  Both Dr. Andrey Campanile and Dr. Abbey Chatters were comfortable with the anatomy, as was I.  \    We copiously irrigated and inspected the subphrenic space, subhepatic space, right paracolic gutter.  It does not seem to be any residual stones.  There is no bile leaking.  Minimal bleeding had been controlled.  We placed a 76 French Blake drain in the Hepatic Space up to the Level of the Cystic Duct Closure Brought It out through One of the Trocar Sites in the Right Flank and Connected to Suction Bulb after Suturing to the Skin.  The Posterior Rectus Sheath, Transversus Abdominis and Internal Oblique Were Closed with Running Sutures of #1 PDS.  The Anterior Rectus Sheath and External Oblique Were Closed with a Running Suture of #1 PDS.  Skin Was Closed with Skin Staples and We Placed Telfa Wicks in between the Staples for Drainage.  Clean bandages were placed the patient taken to PACU in stable condition.  EBL 100 mL.  Counts correct.  Complications none.          Angelia Mould. Derrell Lolling, M.D., FACS General and Minimally Invasive Surgery Breast and Colorectal Surgery  03/16/2015 1:01 PM

## 2015-03-16 NOTE — Progress Notes (Signed)
  Subjective: Stable.  Awake.  Alert.  Pleasant and cooperative. Says right upper quadrant and right flank pain persist.  Still nauseated. Afebrile.  Heart rate 76.  BP 129/75. Lab work yesterday reveals WBC 11,200, LFTs normal, lipase normal. Ultrasound confirms multiple gallstones, borderline all bladder wall thickening.  CBD 1.7 mm.  Liver healthy.  Objective: Vital signs in last 24 hours: Temp:  [98 F (36.7 C)-98.6 F (37 C)] 98.6 F (37 C) (09/06 0133) Pulse Rate:  [64-78] 76 (09/06 0133) Resp:  [16-18] 17 (09/06 0133) BP: (109-145)/(58-95) 129/75 mmHg (09/06 0133) SpO2:  [96 %-100 %] 96 % (09/06 0133) Weight:  [81.647 kg (180 lb)] 81.647 kg (180 lb) (09/05 2100) Last BM Date: 03/15/15  Intake/Output from previous day: 09/05 0701 - 09/06 0700 In: 873.3 [P.O.:120; I.V.:753.3] Out: 150 [Urine:150] Intake/Output this shift: Total I/O In: 873.3 [P.O.:120; I.V.:753.3] Out: 150 [Urine:150]    EXAM: General appearance: Pleasant.  Does not appear to be in any distress.  Cooperative GI: Tender right upper quadrant.  No mass.  Not distended.  No scars or hernias.  Jewelry at umbilicus which will need to be removed.  Lab Results:   Recent Labs  03/15/15 1146  WBC 11.2*  HGB 13.6  HCT 40.3  PLT 341   BMET  Recent Labs  03/15/15 1146  NA 141  K 3.5  CL 107  CO2 26  GLUCOSE 115*  BUN 10  CREATININE 0.70  CALCIUM 9.6   PT/INR No results for input(s): LABPROT, INR in the last 72 hours. ABG No results for input(s): PHART, HCO3 in the last 72 hours.  Invalid input(s): PCO2, PO2  Studies/Results: US Abdomen Complete  03/15/2015   CLINICAL DATA:  Right upper quadrant pain.  EXAM: ULTRASOUND ABDOMEN COMPLETE  COMPARISON:  None.  FINDINGS: Gallbladder: Cholelithiasis with mild gallbladder wall thickening. No pericholecystic fluid. Negative sonographic Murphy sign.  Common bile duct: Diameter: 1.7 mm  Liver: No focal lesion identified. Within normal limits in  parenchymal echogenicity.  IVC: No abnormality visualized.  Pancreas: Limited visualization secondary to overlying bowel gas.  Spleen: Size and appearance within normal limits.  Right Kidney: Length: 11.8 cm. Echogenicity within normal limits. No mass or hydronephrosis visualized.  Left Kidney: Length: 11.1 cm. Echogenicity within normal limits. No mass or hydronephrosis visualized.  Abdominal aorta: No aneurysm visualized.  Other findings: None.  IMPRESSION: Cholelithiasis with mild gallbladder wall thickening. The findings are concerning for, but not diagnostic of acute cholecystitis.   Electronically Signed   By: Elige Ko   On: 03/15/2015 16:17    Anti-infectives: Anti-infectives    None      Assessment/Plan:  Acute and chronic cholecystitis with cholelithiasis. I discussed cholecystectomy with the patient and she desires to proceed with that surgery today. Continue nothing by mouth and IV fluid hydration, pain control and nausea control.  I discussed the indications, details, techniques, and numerous risk of laparoscopic cholecystectomy with glandular gram, possible open cholecystectomy.  She's aware of the risk of bleeding, infection, conversion to open laparotomy, bile leak, wound hernia, injury to adjacent organs such as the intestine or bile duct with major reconstructive surgery.  She understands all these issues and all of her questions are answered.  She agrees with this plan.  Preop orders are written.      Ernestene Mention 03/16/2015

## 2015-03-16 NOTE — Progress Notes (Signed)
Removed patients belly button ring using sterile pliers due to the fact it could not be unscrewed. Patient stated it had been in there for 6 years and had never been taken out.  Patient verbalized that I could use the pliers to remove the ring.  Belly Button Ring removed without complications and area cleaned with alcohol wipe after removal prior to being taken back for surgery.

## 2015-03-16 NOTE — Transfer of Care (Signed)
Immediate Anesthesia Transfer of Care Note  Patient: Lori Donaldson  Procedure(s) Performed: Procedure(s): LAPAROSCOPIC CONVERTED TO OPEN CHOLECYSTECTOMY (N/A)  Patient Location: PACU  Anesthesia Type:General  Level of Consciousness: awake, oriented, patient cooperative, lethargic and responds to stimulation  Airway & Oxygen Therapy: Patient Spontanous Breathing and Patient connected to face mask oxygen  Post-op Assessment: Report given to RN, Post -op Vital signs reviewed and stable and Patient moving all extremities  Post vital signs: Reviewed and stable  Last Vitals:  Filed Vitals:   03/16/15 1318  BP:   Pulse: 110  Temp:   Resp:     Complications: No apparent anesthesia complications

## 2015-03-16 NOTE — Interval H&P Note (Signed)
History and Physical Interval Note:  03/16/2015 9:55 AM  Lori Donaldson  has presented today for surgery, with the diagnosis of cholecystitis  The various methods of treatment have been discussed with the patient and family. After consideration of risks, benefits and other options for treatment, the patient has consented to  Procedure(s): LAPAROSCOPIC CHOLECYSTECTOMY WITH INTRAOPERATIVE CHOLANGIOGRAM (N/A) as a surgical intervention .  The patient's history has been reviewed, patient examined, no change in status, stable for surgery.  I have reviewed the patient's chart and labs.  Questions were answered to the patient's satisfaction.     Ernestene Mention

## 2015-03-16 NOTE — Anesthesia Procedure Notes (Signed)
Procedure Name: Intubation Date/Time: 03/16/2015 11:10 AM Performed by: Edison Pace Pre-anesthesia Checklist: Patient identified, Timeout performed, Emergency Drugs available, Suction available and Patient being monitored Patient Re-evaluated:Patient Re-evaluated prior to inductionOxygen Delivery Method: Circle system utilized Preoxygenation: Pre-oxygenation with 100% oxygen Intubation Type: Cricoid Pressure applied Laryngoscope Size: Mac and 3 Grade View: Grade I Tube type: Oral Tube size: 7.5 mm Number of attempts: 1 Airway Equipment and Method: Stylet Placement Confirmation: positive ETCO2,  ETT inserted through vocal cords under direct vision and breath sounds checked- equal and bilateral Secured at: 21 cm Tube secured with: Tape Dental Injury: Teeth and Oropharynx as per pre-operative assessment  Comments: On DL noted to have green fluid floating in posterior pharynx.   cords clear.  Cricoid pressure applied, immediately intubated.  ett suctioned, nothing suctioned out.  O2 sat stable.  Mild expiratory wheezing after DL.  Albuterol MDI via ETT X 5, lungs clear after 5 mins.  Surgeon informed.

## 2015-03-16 NOTE — Anesthesia Preprocedure Evaluation (Addendum)
Anesthesia Evaluation  Patient identified by MRN, date of birth, ID band Patient awake    Reviewed: Allergy & Precautions, NPO status , Patient's Chart, lab work & pertinent test results  Airway Mallampati: II  TM Distance: >3 FB Neck ROM: Full    Dental no notable dental hx.    Pulmonary neg pulmonary ROS,  breath sounds clear to auscultation  Pulmonary exam normal       Cardiovascular negative cardio ROS Normal cardiovascular examRhythm:Regular Rate:Normal     Neuro/Psych negative neurological ROS  negative psych ROS   GI/Hepatic negative GI ROS, Neg liver ROS,   Endo/Other  negative endocrine ROS  Renal/GU negative Renal ROS  negative genitourinary   Musculoskeletal negative musculoskeletal ROS (+)   Abdominal   Peds negative pediatric ROS (+)  Hematology negative hematology ROS (+)   Anesthesia Other Findings   Reproductive/Obstetrics negative OB ROS                             Anesthesia Physical Anesthesia Plan  ASA: II  Anesthesia Plan: General   Post-op Pain Management:    Induction: Intravenous  Airway Management Planned: Oral ETT  Additional Equipment:   Intra-op Plan:   Post-operative Plan: Extubation in OR  Informed Consent: I have reviewed the patients History and Physical, chart, labs and discussed the procedure including the risks, benefits and alternatives for the proposed anesthesia with the patient or authorized representative who has indicated his/her understanding and acceptance.   Dental advisory given  Plan Discussed with: CRNA  Anesthesia Plan Comments:         Anesthesia Quick Evaluation  

## 2015-03-16 NOTE — Anesthesia Postprocedure Evaluation (Signed)
  Anesthesia Post-op Note  Patient: Lori Donaldson  Procedure(s) Performed: Procedure(s) (LRB): LAPAROSCOPIC CONVERTED TO OPEN CHOLECYSTECTOMY (N/A)  Patient Location: PACU  Anesthesia Type: General  Level of Consciousness: awake and alert   Airway and Oxygen Therapy: Patient Spontanous Breathing  Post-op Pain: mild  Post-op Assessment: Post-op Vital signs reviewed, Patient's Cardiovascular Status Stable, Respiratory Function Stable, Patent Airway and No signs of Nausea or vomiting  Last Vitals:  Filed Vitals:   03/16/15 1709  BP: 119/65  Pulse: 85  Temp: 36.9 C  Resp: 16    Post-op Vital Signs: stable   Complications: No apparent anesthesia complications

## 2015-03-17 LAB — COMPREHENSIVE METABOLIC PANEL
ALBUMIN: 3.2 g/dL — AB (ref 3.5–5.0)
ALK PHOS: 68 U/L (ref 38–126)
ALT: 55 U/L — AB (ref 14–54)
ANION GAP: 5 (ref 5–15)
AST: 61 U/L — ABNORMAL HIGH (ref 15–41)
BILIRUBIN TOTAL: 0.6 mg/dL (ref 0.3–1.2)
BUN: 7 mg/dL (ref 6–20)
CALCIUM: 8.5 mg/dL — AB (ref 8.9–10.3)
CO2: 26 mmol/L (ref 22–32)
CREATININE: 0.54 mg/dL (ref 0.44–1.00)
Chloride: 104 mmol/L (ref 101–111)
GFR calc non Af Amer: >60 mL/min (ref >60)
GLUCOSE: 116 mg/dL — AB (ref 65–99)
Potassium: 4.3 mmol/L (ref 3.5–5.1)
SODIUM: 135 mmol/L (ref 135–145)
TOTAL PROTEIN: 6.7 g/dL (ref 6.5–8.1)

## 2015-03-17 LAB — CBC
HCT: 32.4 % — ABNORMAL LOW (ref 36.0–46.0)
Hemoglobin: 10.5 g/dL — ABNORMAL LOW (ref 12.0–15.0)
MCH: 29 pg (ref 26.0–34.0)
MCHC: 32.4 g/dL (ref 30.0–36.0)
MCV: 89.5 fL (ref 78.0–100.0)
PLATELETS: 269 10*3/uL (ref 150–400)
RBC: 3.62 MIL/uL — ABNORMAL LOW (ref 3.87–5.11)
RDW: 12.5 % (ref 11.5–15.5)
WBC: 16.8 10*3/uL — ABNORMAL HIGH (ref 4.0–10.5)

## 2015-03-17 MED ORDER — PANTOPRAZOLE SODIUM 40 MG PO TBEC
40.0000 mg | DELAYED_RELEASE_TABLET | Freq: Every day | ORAL | Status: DC
  Administered 2015-03-17 – 2015-03-19 (×3): 40 mg via ORAL
  Filled 2015-03-17 (×3): qty 1

## 2015-03-17 NOTE — Progress Notes (Signed)
[facility] Progress Note  1 Day Post-Op  Subjective: Pt doing well, no N/V, minimal abdominal pain.  Ambulated some OOB.  Hasn't been able to urinate, I&O cath twice, pending bladder scan in a few hours.  Mother at bedside.    Objective: Vital signs in last 24 hours: Temp:  [98 F (36.7 C)-99.5 F (37.5 C)] 99.2 F (37.3 C) (09/07 0518) Pulse Rate:  [64-112] 64 (09/07 0518) Resp:  [11-22] 16 (09/07 0518) BP: (114-148)/(61-87) 121/67 mmHg (09/07 0518) SpO2:  [94 %-100 %] 99 % (09/07 0518) Last BM Date: 03/15/15  Intake/Output from previous day: 09/06 0701 - 09/07 0700 In: 4200 [I.V.:4200] Out: 2700 [Urine:2500; Drains:100] Intake/Output this shift:    PE: Gen:  Alert, NAD, pleasant Card:  RRR, no M/G/R heard Pulm:  CTA, no W/R/R Abd: Soft, mild tenderness, mild distension, +BS, no HSM, large RUQ incision with sanguinous drainage on bandage, drain with sanguinous drainage   Lab Results:   Recent Labs  03/16/15 1540 03/17/15 0437  WBC 20.7* 16.8*  HGB 12.0 10.5*  HCT 36.5 32.4*  PLT 297 269   BMET  Recent Labs  03/15/15 1146 03/16/15 1540 03/17/15 0437  NA 141  --  135  K 3.5  --  4.3  CL 107  --  104  CO2 26  --  26  GLUCOSE 115*  --  116*  BUN 10  --  7  CREATININE 0.70 0.62 0.54  CALCIUM 9.6  --  8.5*   PT/INR No results for input(s): LABPROT, INR in the last 72 hours. CMP     Component Value Date/Time   NA 135 03/17/2015 0437   K 4.3 03/17/2015 0437   CL 104 03/17/2015 0437   CO2 26 03/17/2015 0437   GLUCOSE 116* 03/17/2015 0437   BUN 7 03/17/2015 0437   CREATININE 0.54 03/17/2015 0437   CALCIUM 8.5* 03/17/2015 0437   PROT 6.7 03/17/2015 0437   ALBUMIN 3.2* 03/17/2015 0437   AST 61* 03/17/2015 0437   ALT 55* 03/17/2015 0437   ALKPHOS 68 03/17/2015 0437   BILITOT 0.6 03/17/2015 0437   GFRNONAA >60 03/17/2015 0437   GFRAA >60 03/17/2015 0437   Lipase     Component Value Date/Time   LIPASE 19* 03/15/2015 1146        Studies/Results: US Abdomen Complete  03/15/2015   CLINICAL DATA:  Right upper quadrant pain.  EXAM: ULTRASOUND ABDOMEN COMPLETE  COMPARISON:  None.  FINDINGS: Gallbladder: Cholelithiasis with mild gallbladder wall thickening. No pericholecystic fluid. Negative sonographic Murphy sign.  Common bile duct: Diameter: 1.7 mm  Liver: No focal lesion identified. Within normal limits in parenchymal echogenicity.  IVC: No abnormality visualized.  Pancreas: Limited visualization secondary to overlying bowel gas.  Spleen: Size and appearance within normal limits.  Right Kidney: Length: 11.8 cm. Echogenicity within normal limits. No mass or hydronephrosis visualized.  Left Kidney: Length: 11.1 cm. Echogenicity within normal limits. No mass or hydronephrosis visualized.  Abdominal aorta: No aneurysm visualized.  Other findings: None.  IMPRESSION: Cholelithiasis with mild gallbladder wall thickening. The findings are concerning for, but not diagnostic of acute cholecystitis.   Electronically Signed   By: Elige Ko   On: 03/15/2015 16:17    Anti-infectives: Anti-infectives    Start     Dose/Rate Route Frequency Ordered Stop   03/16/15 2200  cefTRIAXone (ROCEPHIN) 2 g in dextrose 5 % 50 mL IVPB     2 g 100 mL/hr over 30 Minutes Intravenous Every  24 hours 03/16/15 1507     03/16/15 1300  cefTRIAXone (ROCEPHIN) 1 g in dextrose 5 % 50 mL IVPB     1 g 120 mL/hr over 30 Minutes Intravenous  Once 03/16/15 1225 03/16/15 1215   03/16/15 0615  ceFAZolin (ANCEF) IVPB 2 g/50 mL premix  Status:  Discontinued     2 g 100 mL/hr over 30 Minutes Intravenous 3 times per day 03/16/15 0604 03/16/15 1507       Assessment/Plan Acute cholecystitis with cholelithiasis POD #1 s/p Laparoscopic, converted to open cholecystectomy -Advance to clears, fulls at dinner -LFTS okay this am, less concern for CBD stone -Ambulate and IS -SCD's and lovenox -Encouraged orals for pain -Watch drain output, hopefully d/c drain  before discharge, but may need to go home with it if output is high or concern for leak -Maintain wicks for now, will need pulled out before discharge -Plan for d/c in a few days     LOS: 1 day    Nonie Hoyer 03/17/2015, 9:50 AM Pager: [phone]

## 2015-03-18 ENCOUNTER — Encounter (HOSPITAL_COMMUNITY): Payer: Self-pay | Admitting: General Surgery

## 2015-03-18 LAB — COMPREHENSIVE METABOLIC PANEL
ALT: 40 U/L (ref 14–54)
AST: 41 U/L (ref 15–41)
Albumin: 2.9 g/dL — ABNORMAL LOW (ref 3.5–5.0)
Alkaline Phosphatase: 71 U/L (ref 38–126)
Anion gap: 6 (ref 5–15)
BILIRUBIN TOTAL: 0.4 mg/dL (ref 0.3–1.2)
BUN: 8 mg/dL (ref 6–20)
CALCIUM: 8.3 mg/dL — AB (ref 8.9–10.3)
CO2: 25 mmol/L (ref 22–32)
CREATININE: 0.67 mg/dL (ref 0.44–1.00)
Chloride: 108 mmol/L (ref 101–111)
GFR calc Af Amer: >60 mL/min (ref >60)
Glucose, Bld: 97 mg/dL (ref 65–99)
Potassium: 4.1 mmol/L (ref 3.5–5.1)
Sodium: 139 mmol/L (ref 135–145)
TOTAL PROTEIN: 6 g/dL — AB (ref 6.5–8.1)

## 2015-03-18 LAB — CBC
HEMATOCRIT: 28.6 % — AB (ref 36.0–46.0)
Hemoglobin: 9.5 g/dL — ABNORMAL LOW (ref 12.0–15.0)
MCH: 30 pg (ref 26.0–34.0)
MCHC: 33.2 g/dL (ref 30.0–36.0)
MCV: 90.2 fL (ref 78.0–100.0)
Platelets: 276 10*3/uL (ref 150–400)
RBC: 3.17 MIL/uL — AB (ref 3.87–5.11)
RDW: 12.6 % (ref 11.5–15.5)
WBC: 9.2 10*3/uL (ref 4.0–10.5)

## 2015-03-18 LAB — LIPASE, BLOOD: LIPASE: 30 U/L (ref 22–51)

## 2015-03-18 MED ORDER — METHOCARBAMOL 500 MG PO TABS: 1000.0000 mg | ORAL_TABLET | Freq: Three times a day (TID) | ORAL | Status: DC | PRN

## 2015-03-18 MED ORDER — HYDROMORPHONE HCL 1 MG/ML IJ SOLN: 1.0000 mg | INTRAMUSCULAR | Status: DC | PRN

## 2015-03-18 NOTE — Progress Notes (Signed)
Pt c/o tingling in lt 4th and 5th fingers radiating along lateral forearm to elbow. "It's been like this for about 10 minutes," reported pt. No redness noted. Good capillary refill and lt radial pulse strong. IV site on LPFA WNL w/ no redness nor swelling noted. IV flushes well with good blood return. Position of lt arm changed and elevated on pillow. Warm pack applied to lateral aspect of lt hand. Reassurance given.

## 2015-03-18 NOTE — Progress Notes (Signed)
Pt reported tingling in lt 4th &5th finger as well as arm decrease. Pt stated "it's getting better." No changes from previous assessment. Pt reassured.

## 2015-03-18 NOTE — Progress Notes (Signed)
[facility] Progress Note  2 Days Post-Op  Subjective: Pt seen and examined at bedside and appears well. She is tearful due to anxiety after the surgery. Pt reports her pain is improving and she is only having mild pain around her incision/drain site. She denies N/V and is tolerating full liquids. She is urinating on her own today. Pt has not had flatus or a BM. Pt denies using ice packs yesterday over her surgical site. She walked 2 laps yesterday. Pt has no complaints or questions today. Objective: Vital signs in last 24 hours: Temp:  [98.2 F (36.8 C)-98.7 F (37.1 C)] 98.4 F (36.9 C) (09/08 0524) Pulse Rate:  [73-85] 83 (09/08 0524) Resp:  [15-16] 16 (09/08 0524) BP: (100-110)/(55-68) 104/55 mmHg (09/08 0524) SpO2:  [93 %-99 %] 99 % (09/08 0524) Last BM Date: 03/15/15  Intake/Output from previous day: 09/07 0701 - 09/08 0700 In: 2740 [P.O.:240; I.V.:2500] Out: 880 [Urine:800; Drains:80] Intake/Output this shift:    Intake/Output Summary (Last 24 hours) at 03/18/15 0854 Last data filed at 03/18/15 0600  Gross per 24 hour  Intake   2740 ml  Output    880 ml  Net   1860 ml   PE: Gen:  Alert, NAD, pleasant Card:  RRR, no M/G/R heard. Radial and pedal pulses 2+BL Pulm:  CTA, no W/R/R Abd: mildly distended, tenderness around surgical site, +BS, no HSM, incisions C/D/I, drain with mild-moderate sanguinous drainage Ext:  No erythema, edema, or tenderness  Lab Results:   Recent Labs  03/17/15 0437 03/18/15 0440  WBC 16.8* 9.2  HGB 10.5* 9.5*  HCT 32.4* 28.6*  PLT 269 276   BMET  Recent Labs  03/17/15 0437 03/18/15 0440  NA 135 139  K 4.3 4.1  CL 104 108  CO2 26 25  GLUCOSE 116* 97  BUN 7 8  CREATININE 0.54 0.67  CALCIUM 8.5* 8.3*   PT/INR No results for input(s): LABPROT, INR in the last 72 hours. CMP     Component Value Date/Time   NA 139 03/18/2015 0440   K 4.1 03/18/2015 0440   CL 108 03/18/2015 0440   CO2 25 03/18/2015 0440   GLUCOSE 97 03/18/2015 0440   BUN 8 03/18/2015 0440   CREATININE 0.67 03/18/2015 0440   CALCIUM 8.3* 03/18/2015 0440   PROT 6.0* 03/18/2015 0440   ALBUMIN 2.9* 03/18/2015 0440   AST 41 03/18/2015 0440   ALT 40 03/18/2015 0440   ALKPHOS 71 03/18/2015 0440   BILITOT 0.4 03/18/2015 0440   GFRNONAA >60 03/18/2015 0440   GFRAA >60 03/18/2015 0440   Lipase     Component Value Date/Time   LIPASE 30 03/18/2015 0440   Studies/Results: No results found.  Anti-infectives: Anti-infectives    Start     Dose/Rate Route Frequency Ordered Stop   03/16/15 2200  cefTRIAXone (ROCEPHIN) 2 g in dextrose 5 % 50 mL IVPB     2 g 100 mL/hr over 30 Minutes Intravenous Every 24 hours 03/16/15 1507     03/16/15 1300  cefTRIAXone (ROCEPHIN) 1 g in dextrose 5 % 50 mL IVPB     1 g 120 mL/hr over 30 Minutes Intravenous  Once 03/16/15 1225 03/16/15 1215   03/16/15 0615  ceFAZolin (ANCEF) IVPB 2 g/50 mL premix  Status:  Discontinued     2 g 100 mL/hr over 30 Minutes Intravenous 3 times per day 03/16/15 0604 03/16/15 1507      Assessment/Plan  Acute cholecystitis with cholelithiasis POD #2  s/p Laparoscopic, converted to open cholecystectomy -Advance to soft diet -LFTS WNL this am, pt also had normal LFTs yesterday am, less concern for residual CBD stone. -Ambulate and IS -SCD's and lovenox -Re-encouraged orals and ice for pain. Start Robaxin 1000 mg tablet PO daily PRN for continued pain control. -Watch drain output, hopefully d/c drain before discharge, but may need to go home with it if output is high or concern for leak -remove wicks today and change dressing. -Plan for d/c in 1-2 days.    LOS: 2 days    Bobbye Riggs 03/18/2015, 7:43 AM Pager: [phone]

## 2015-03-18 NOTE — Progress Notes (Signed)
Pt reported tingling nearly gone in lt fingers as noted earlier. C/o pressure around IV site. No redness noted nor swelling. Flushed well yet no blood return noted. Infusion of IVF's stopped and IV team notified. Pt difficult stick.

## 2015-03-19 MED ORDER — OXYCODONE-ACETAMINOPHEN 5-325 MG PO TABS: 1.0000 | ORAL_TABLET | Freq: Four times a day (QID) | ORAL | Status: DC | PRN

## 2015-03-19 MED ORDER — METHOCARBAMOL 500 MG PO TABS: 1000.0000 mg | ORAL_TABLET | Freq: Three times a day (TID) | ORAL | Status: DC | PRN

## 2015-03-19 MED ORDER — POLYETHYLENE GLYCOL 3350 17 G PO PACK
17.0000 g | PACK | Freq: Once | ORAL | Status: AC
  Administered 2015-03-19: 17 g via ORAL
  Filled 2015-03-19: qty 1

## 2015-03-19 NOTE — Progress Notes (Signed)
Patient's vital are WNL, tolerating diet, incision w/o signs of infection and pain is controlled. Discussed discharge instructions with patient. Had no questions nor concerns. Discharged to home

## 2015-03-19 NOTE — Discharge Summary (Signed)
[facility] Discharge Summary   Patient ID: Lori Donaldson MRN: [id number] DOB/AGE: 1986-05-17 29 y.o.  Admit date: 03/15/2015 Discharge date: 03/19/2015  Admitting Diagnosis: Subacute cholecystitis, cholelithiasis, unrelenting biliary colic  Discharge Diagnosis Patient Active Problem List   Diagnosis Date Noted  . Cholecystitis, acute with cholelithiasis 03/16/2015    Consultants none  Imaging: ULTRASOUND ABDOMEN IMPRESSION: Cholelithiasis with mild gallbladder wall thickening. The findings are concerning for, but not diagnostic of acute cholecystitis.  Procedures  Dr. Claud Kelp (03/16/15) - Laparoscopic, Converted to Open Cholecystectomy  Hospital Course:  Patient is a 29 yo Hispanic female with two year hx of intermittent abdominal pain who presented to [facility] with abdominal pain. Pt was diagnosed over one year ago with symptomatic cholelithiasis. Seen by surgery but unable to proceed due to financial concerns. Continued intermittent pain approx twice per month. Now with onset pain 48 hours ago which unrelenting after eating pizza and steak. Nausea and emesis and dry heaves. No fever, chills, jaundice or acholic stools.   Workup included a RUQ U/S concerning for cholecystitis, with cholelithiasis and mild gallbladder wall thickening. Patient was admitted and underwent procedure listed above. Tolerated procedure well and was transferred to the floor. During the procedure a RUQ JP drain was placed. Before diet was advanced she had a post-operative ileus which began to self-resolve quickly. Diet was then advanced as tolerated. Pt experienced trouble voiding on POD1, requiring two in-and-out straight bladder catheterizations. By POD2 she was voiding on her own without complication. The pts JP drain output remained serosanguinous and decreased from 100 mL on POD1 to less than 50 mL on POD3, therefore it was removed prior to pt discharge. On POD3, the patient was  voiding well, tolerating diet, ambulating well, pain well controlled, vital signs stable, incisions c/d/i and felt stable for discharge home. Pt will follow-up in the [facility] office to have het staples removed in 5-7 days. Patient will follow up in our office in 2 weeks and knows to call with questions or concerns. She will call to confirm appointment date/time.    Physical Exam: Gen: Alert, NAD, pleasant Card: RRR, no M/G/R heard, radial pulses 2+ BL Pulm: CTA, no W/R/R Abd: Soft, mildly tender, ND, +BS, no HSM, incisions C/D/I, RUQ drain in place with 10 mL serosanguinous drainage. Removed prior to discharge. Ext: No erythema, edema, or tenderness     Medication List    TAKE these medications        aspirin-acetaminophen-caffeine 250-250-65 MG per tablet  Commonly known as:  EXCEDRIN MIGRAINE  Take 2 tablets by mouth every 6 (six) hours as needed for headache.     ibuprofen 200 MG tablet  Commonly known as:  ADVIL,MOTRIN  Take 400-800 mg by mouth every 6 (six) hours as needed for headache or cramping.     ICY HOT BACK 5 % Pads  Generic drug:  Menthol (Topical Analgesic)  Apply 1 patch topically daily as needed (back pain).     methocarbamol 500 MG tablet  Commonly known as:  ROBAXIN  Take 2 tablets (1,000 mg total) by mouth every 8 (eight) hours as needed for muscle spasms.     oxyCODONE-acetaminophen 5-325 MG per tablet  Commonly known as:  PERCOCET/ROXICET  Take 1-2 tablets by mouth every 6 (six) hours as needed for moderate pain.     TIGER BALM ARTHRITIS RUB EX  Apply 1 application topically 2 (two) times daily as needed (muscle pain).        Follow-up Information  Follow up with Ernestene Mention, MD. Call in 2 weeks.   Specialty:  General Surgery   Why:  call to confirm an appointment date and time in two weeks   Contact information:   [address] [place] [address] [phone]       Follow up with [facility] OFFICE [place] In 5 days.   Why:  call to  confirm the appointment date and time of staple removal next week.   Contact information:   [address] [place] [address] [phone]      Signed: Mikal Plane, PA-S Physician Assistant Student, [facility] [phone]   03/19/2015, 9:56 AM

## 2015-03-19 NOTE — Progress Notes (Signed)
[facility] Progress Note  3 Days Post-Op  Subjective: Pt seen an examined at bedside this morning and is doing well. She states that she began passing gas yesterday but has not had a BM. She expressed crampy,mild abdominal pain and mild tenderness at the surgical site, but her pain is improving. She is still urinating well and tolerating soft foods. She increased her walking yesterday. Her pain is currently controlled with percocet PRN. She expresses some anxiety regarding discharge, but states that if her drain is removed she will feel more comfortable going home.  Objective: Vital signs in last 24 hours: Temp:  [98.5 F (36.9 C)-99 F (37.2 C)] 98.5 F (36.9 C) (09/09 0508) Pulse Rate:  [74-96] 74 (09/09 0508) Resp:  [16-17] 16 (09/09 0508) BP: (104-125)/(56-72) 125/69 mmHg (09/09 0508) SpO2:  [93 %-97 %] 93 % (09/09 0508) Last BM Date: 03/15/15  Intake/Output from previous day: 09/08 0701 - 09/09 0700 In: 986.7 [I.V.:986.7] Out: 2055 [Urine:2000; Drains:55] Intake/Output this shift:    Intake/Output Summary (Last 24 hours) at 03/19/15 0816 Last data filed at 03/19/15 0509  Gross per 24 hour  Intake 986.67 ml  Output   2055 ml  Net -1068.33 ml   PE: Gen:  Alert, NAD, pleasant Card:  RRR, no M/G/R heard, radial pulses 2+ BL Pulm:  CTA, no W/R/R Abd: Soft, mildly tender, ND, +BS, no HSM, incisions C/D/I, RUQ drain in place with 10 mL serosanguinous drainage Ext:  No erythema, edema, or tenderness  Lab Results:   Recent Labs  03/17/15 0437 03/18/15 0440  WBC 16.8* 9.2  HGB 10.5* 9.5*  HCT 32.4* 28.6*  PLT 269 276   BMET  Recent Labs  03/17/15 0437 03/18/15 0440  NA 135 139  K 4.3 4.1  CL 104 108  CO2 26 25  GLUCOSE 116* 97  BUN 7 8  CREATININE 0.54 0.67  CALCIUM 8.5* 8.3*   PT/INR No results for input(s): LABPROT, INR in the last 72 hours. CMP     Component Value Date/Time   NA 139 03/18/2015 0440   K 4.1 03/18/2015 0440   CL 108  03/18/2015 0440   CO2 25 03/18/2015 0440   GLUCOSE 97 03/18/2015 0440   BUN 8 03/18/2015 0440   CREATININE 0.67 03/18/2015 0440   CALCIUM 8.3* 03/18/2015 0440   PROT 6.0* 03/18/2015 0440   ALBUMIN 2.9* 03/18/2015 0440   AST 41 03/18/2015 0440   ALT 40 03/18/2015 0440   ALKPHOS 71 03/18/2015 0440   BILITOT 0.4 03/18/2015 0440   GFRNONAA >60 03/18/2015 0440   GFRAA >60 03/18/2015 0440   Lipase     Component Value Date/Time   LIPASE 30 03/18/2015 0440     Studies/Results: No results found.  Anti-infectives: Anti-infectives    Start     Dose/Rate Route Frequency Ordered Stop   03/16/15 2200  cefTRIAXone (ROCEPHIN) 2 g in dextrose 5 % 50 mL IVPB     2 g 100 mL/hr over 30 Minutes Intravenous Every 24 hours 03/16/15 1507     03/16/15 1300  cefTRIAXone (ROCEPHIN) 1 g in dextrose 5 % 50 mL IVPB     1 g 120 mL/hr over 30 Minutes Intravenous  Once 03/16/15 1225 03/16/15 1215   03/16/15 0615  ceFAZolin (ANCEF) IVPB 2 g/50 mL premix  Status:  Discontinued     2 g 100 mL/hr over 30 Minutes Intravenous 3 times per day 03/16/15 0604 03/16/15 1507      Assessment/Plan  Acute  cholecystitis with cholelithiasis POD #3 s/p Laparoscopic, converted to open cholecystectomy -continue soft diet -continue to increase ambulation time, IS - SCD's and lovenox until d/c - oral pain management with Percocet and Robaxin - dry dressing change - remove drain today, fluid output has decreased from 100 mL daily to less than 50 mL daily of serosanguinous drainage - discharge home this afternoon with wound care and activity instructions and FU appointment in 2 weeks.   LOS: 3 days    Bobbye Riggs 03/19/2015, 8:13 AM Pager: [phone]

## 2015-03-19 NOTE — Discharge Instructions (Signed)
[facility]      [facility] [phone]  OPEN ABDOMINAL SURGERY: POST OP INSTRUCTIONS  Always review your discharge instruction sheet given to you by the facility where your surgery was performed.  IF YOU HAVE DISABILITY OR FAMILY LEAVE FORMS, YOU MUST BRING THEM TO THE OFFICE FOR PROCESSING.  PLEASE DO NOT GIVE THEM TO YOUR DOCTOR.  1. A prescription for pain medication may be given to you upon discharge.  Take your pain medication as prescribed, if needed.  If narcotic pain medicine is not needed, then you may take acetaminophen (Tylenol) or ibuprofen (Advil) as needed. 2. Take your usually prescribed medications unless otherwise directed. 3. If you need a refill on your pain medication, please contact your pharmacy. They will contact our office to request authorization.  Prescriptions will not be filled after 5pm or on week-ends. 4. You should follow a light diet the first few days after arrival home, such as soup and crackers, pudding, etc.unless your doctor has advised otherwise. A high-fiber, low fat diet can be resumed as tolerated.   Be sure to include lots of fluids daily. Most patients will experience some swelling and bruising on the chest and neck area.  Ice packs will help.  Swelling and bruising can take several days to resolve 5. Most patients will experience some swelling and bruising in the area of the incision. Ice pack will help. Swelling and bruising can take several days to resolve..  6. It is common to experience some constipation if taking pain medication after surgery.  Increasing fluid intake and taking a stool softener will usually help or prevent this problem from occurring.  A mild laxative (Milk of Magnesia or Miralax) should be taken according to package directions if there are no bowel movements after 48 hours. 7.  You may have steri-strips (small skin tapes) in place directly over the incision.  These strips should be left on the skin for 7-10 days.  If your  surgeon used skin glue on the incision, you may shower in 24 hours.  The glue will flake off over the next 2-3 weeks.  Any sutures or staples will be removed at the office during your follow-up visit. You may find that a light gauze bandage over your incision may keep your staples from being rubbed or pulled. You may shower and replace the bandage daily. 8. ACTIVITIES:  You may resume regular (light) daily activities beginning the next day--such as daily self-care, walking, climbing stairs--gradually increasing activities as tolerated.  You may have sexual intercourse when it is comfortable.  Refrain from any heavy lifting or straining until approved by your doctor. a. You may drive when you no longer are taking prescription pain medication, you can comfortably wear a seatbelt, and you can safely maneuver your car and apply brakes b. Return to Work: ___________________________________ 9. You should see your doctor in the office for a follow-up appointment approximately two weeks after your surgery.  Make sure that you call for this appointment within a day or two after you arrive home to insure a convenient appointment time. OTHER INSTRUCTIONS:  _____________________________________________________________ _____________________________________________________________  WHEN TO CALL YOUR DOCTOR: 1. Fever over 101.0 2. Inability to urinate 3. Nausea and/or vomiting 4. Extreme swelling or bruising 5. Continued bleeding from incision. 6. Increased pain, redness, or drainage from the incision. 7. Difficulty swallowing or breathing 8. Muscle cramping or spasms. 9. Numbness or tingling in hands or feet or around lips.  The clinic staff is available to   answer your questions during regular business hours.  Please don't hesitate to call and ask to speak to one of the nurses if you have concerns.  For further questions, please visit [email or web address]   

## 2016-07-10 DIAGNOSIS — N83209 Unspecified ovarian cyst, unspecified side: Secondary | ICD-10-CM

## 2016-07-10 HISTORY — DX: Unspecified ovarian cyst, unspecified side: N83.209

## 2016-12-14 LAB — OB RESULTS CONSOLE GBS: GBS: NEGATIVE

## 2017-04-24 ENCOUNTER — Emergency Department (HOSPITAL_COMMUNITY): Payer: BLUE CROSS/BLUE SHIELD

## 2017-04-24 ENCOUNTER — Encounter (HOSPITAL_COMMUNITY): Payer: Self-pay | Admitting: Emergency Medicine

## 2017-04-24 ENCOUNTER — Emergency Department (HOSPITAL_COMMUNITY)
Admission: EM | Admit: 2017-04-24 | Discharge: 2017-04-24 | Disposition: A | Discharge status: Home / Self Care | Payer: BLUE CROSS/BLUE SHIELD | Attending: Emergency Medicine | Admitting: Emergency Medicine

## 2017-04-24 DIAGNOSIS — N83291 Other ovarian cyst, right side: Secondary | ICD-10-CM | POA: Insufficient documentation

## 2017-04-24 DIAGNOSIS — B9689 Other specified bacterial agents as the cause of diseases classified elsewhere: Secondary | ICD-10-CM | POA: Insufficient documentation

## 2017-04-24 DIAGNOSIS — Z79899 Other long term (current) drug therapy: Secondary | ICD-10-CM | POA: Diagnosis not present

## 2017-04-24 DIAGNOSIS — N83209 Unspecified ovarian cyst, unspecified side: Secondary | ICD-10-CM

## 2017-04-24 DIAGNOSIS — R1084 Generalized abdominal pain: Secondary | ICD-10-CM | POA: Diagnosis present

## 2017-04-24 DIAGNOSIS — N760 Acute vaginitis: Secondary | ICD-10-CM | POA: Diagnosis not present

## 2017-04-24 LAB — COMPREHENSIVE METABOLIC PANEL
ALK PHOS: 88 U/L (ref 38–126)
ALT: 14 U/L (ref 14–54)
AST: 16 U/L (ref 15–41)
Albumin: 4 g/dL (ref 3.5–5.0)
Anion gap: 6 (ref 5–15)
BUN: 11 mg/dL (ref 6–20)
CHLORIDE: 108 mmol/L (ref 101–111)
CO2: 24 mmol/L (ref 22–32)
CREATININE: 0.56 mg/dL (ref 0.44–1.00)
Calcium: 9 mg/dL (ref 8.9–10.3)
GFR calc Af Amer: >60 mL/min (ref >60)
GLUCOSE: 91 mg/dL (ref 65–99)
POTASSIUM: 3.9 mmol/L (ref 3.5–5.1)
SODIUM: 138 mmol/L (ref 135–145)
Total Bilirubin: 0.5 mg/dL (ref 0.3–1.2)
Total Protein: 7.2 g/dL (ref 6.5–8.1)

## 2017-04-24 LAB — CBC
HEMATOCRIT: 37.3 % (ref 36.0–46.0)
Hemoglobin: 12.3 g/dL (ref 12.0–15.0)
MCH: 28.9 pg (ref 26.0–34.0)
MCHC: 33 g/dL (ref 30.0–36.0)
MCV: 87.6 fL (ref 78.0–100.0)
PLATELETS: 279 10*3/uL (ref 150–400)
RBC: 4.26 MIL/uL (ref 3.87–5.11)
RDW: 12.7 % (ref 11.5–15.5)
WBC: 6.6 10*3/uL (ref 4.0–10.5)

## 2017-04-24 LAB — RAPID HIV SCREEN (HIV 1/2 AB+AG)
HIV 1/2 Antibodies: NONREACTIVE
HIV-1 P24 Antigen - HIV24: NONREACTIVE

## 2017-04-24 LAB — WET PREP, GENITAL
SPERM: NONE SEEN
TRICH WET PREP: NONE SEEN
YEAST WET PREP: NONE SEEN

## 2017-04-24 LAB — I-STAT BETA HCG BLOOD, ED (MC, WL, AP ONLY): I-stat hCG, quantitative: <5 m[IU]/mL (ref <5)

## 2017-04-24 LAB — OB RESULTS CONSOLE GC/CHLAMYDIA: GC PROBE AMP, GENITAL: NEGATIVE

## 2017-04-24 LAB — LIPASE, BLOOD: LIPASE: 26 U/L (ref 11–51)

## 2017-04-24 MED ORDER — MORPHINE SULFATE (PF) 4 MG/ML IV SOLN
4.0000 mg | Freq: Once | INTRAVENOUS | Status: AC
  Administered 2017-04-24: 4 mg via INTRAVENOUS
  Filled 2017-04-24: qty 1

## 2017-04-24 MED ORDER — PROMETHAZINE HCL 25 MG PO TABS: 25.0000 mg | ORAL_TABLET | Freq: Four times a day (QID) | ORAL | 0 refills | Status: DC | PRN

## 2017-04-24 MED ORDER — CEFTRIAXONE SODIUM 250 MG IJ SOLR
250.0000 mg | Freq: Once | INTRAMUSCULAR | Status: AC
  Administered 2017-04-24: 250 mg via INTRAMUSCULAR
  Filled 2017-04-24: qty 250

## 2017-04-24 MED ORDER — METRONIDAZOLE 500 MG PO TABS: 500.0000 mg | ORAL_TABLET | Freq: Two times a day (BID) | ORAL | 0 refills | Status: DC

## 2017-04-24 MED ORDER — LIDOCAINE HCL (PF) 1 % IJ SOLN
INTRAMUSCULAR | Status: AC
  Administered 2017-04-24: 1 mL
  Filled 2017-04-24: qty 5

## 2017-04-24 MED ORDER — AZITHROMYCIN 250 MG PO TABS
1000.0000 mg | ORAL_TABLET | Freq: Once | ORAL | Status: AC
  Administered 2017-04-24: 1000 mg via ORAL
  Filled 2017-04-24: qty 4

## 2017-04-24 MED ORDER — IBUPROFEN 600 MG PO TABS: 600.0000 mg | ORAL_TABLET | Freq: Four times a day (QID) | ORAL | 0 refills | Status: DC | PRN

## 2017-04-24 NOTE — ED Notes (Signed)
Esignature not working. Pt agreeable to discharge and received instructions.

## 2017-04-24 NOTE — ED Provider Notes (Signed)
[facility] EMERGENCY DEPARTMENT Provider Note   CSN: [id number] Arrival date & time: 04/24/17  8295     History   Chief Complaint Chief Complaint  Patient presents with  . Abdominal Pain    HPI Lori Donaldson is a 31 y.o. female.  HPI   31 year old female with prior history of cholecystectomy presenting for evaluation of abdominal pain.atient developed acute onset of low abdominal pain earlier this morning. States that she got up to urinate, and when she went back to bed she developed acute sharp pain to the suprapubic region that radiates to both side of her low back and low abdomen. Pain initially intense, 10 out of 10 which has since improved to 8 out of 10. Increasing pain with palpation and with moving. Pain now resides to her right lower abdomen.she denies any specific treatment tried. No associated fever, chills, lightheadedness, dizziness, chest pain, shortness of breath,dysuria, hematuria, vaginal bleeding, vaginal discharge, or rash.  no nausea vomiting and diarrhea.  Pt is G0P0, LMP 09/25.  Denies any new sexual partner.denies any recent strenuous activities or heavy lifting.      History reviewed. No pertinent past medical history.  Patient Active Problem List   Diagnosis Date Noted  . Cholecystitis, acute with cholelithiasis 03/16/2015    Past Surgical History:  Procedure Laterality Date  . CHOLECYSTECTOMY N/A 03/16/2015   Procedure: LAPAROSCOPIC CONVERTED TO OPEN CHOLECYSTECTOMY;  Surgeon: Claud Kelp, MD;  Location: [facility];  Service: General;  Laterality: N/A;    OB History    No data available       Home Medications    Prior to Admission medications   Medication Sig Start Date End Date Taking? Authorizing Provider  aspirin-acetaminophen-caffeine (EXCEDRIN MIGRAINE) [phone] MG per tablet Take 2 tablets by mouth every 6 (six) hours as needed for headache.    [provider]  ibuprofen (ADVIL,MOTRIN) 200 MG tablet  Take 400-800 mg by mouth every 6 (six) hours as needed for headache or cramping.    [provider]  Menthol, Topical Analgesic, (ICY HOT BACK) 5 % PADS Apply 1 patch topically daily as needed (back pain).    [provider]  Menthol-Camphor (TIGER BALM ARTHRITIS RUB EX) Apply 1 application topically 2 (two) times daily as needed (muscle pain).    [provider]  methocarbamol (ROBAXIN) 500 MG tablet Take 2 tablets (1,000 mg total) by mouth every 8 (eight) hours as needed for muscle spasms. 03/19/15   Nonie Hoyer, PA-C  oxyCODONE-acetaminophen (PERCOCET/ROXICET) 5-325 MG per tablet Take 1-2 tablets by mouth every 6 (six) hours as needed for moderate pain. 03/19/15   Nonie Hoyer, PA-C    Family History No family history on file.  Social History Social History  Substance Use Topics  . Smoking status: Never Smoker  . Smokeless tobacco: Never Used  . Alcohol use No     Allergies   Patient has no known allergies.   Review of Systems Review of Systems  All other systems reviewed and are negative.    Physical Exam Updated Vital Signs BP 115/82 (BP Location: Left Arm)   Pulse 85   Temp 98.1 F (36.7 C) (Oral)   Resp 20   Ht  (1.626 m)   Wt 86.2 kg (190 lb)   LMP 04/03/2017 (Approximate)   SpO2 99%   BMI 32.61 kg/m   Physical Exam  Constitutional: She appears well-developed and well-nourished. No distress.  Obese female nontoxic in appearance  HENT:  Head: Atraumatic.  Eyes: Conjunctivae are normal.  Neck: Neck supple.  Cardiovascular: Normal rate and regular rhythm.   Pulmonary/Chest: Effort normal and breath sounds normal.  Abdominal: Soft. Bowel sounds are normal. She exhibits no distension. There is tenderness (tenderness to suprapubic and right lower quadrant on palpation no guarding or rebound tenderness).  Genitourinary:  Genitourinary Comments: Chaperone present during exam. No inguinal lymph biopsy or inguinal hernia noted.  Normal external genitalia free of lesion or rash. Mild discomfort with speculum insertion. Moderate amount of yellow vaginal discharge noted. Cervical os is closed, some dystrophic skin changes at the T-zone were noted. On bimanual examination right adnexal tenderness without cervical motion tenderness, no obvious mass appreciated.  Neurological: She is alert.  Skin: No rash noted.  Psychiatric: She has a normal mood and affect.  Nursing note and vitals reviewed.    ED Treatments / Results  Labs (all labs ordered are listed, but only abnormal results are displayed) Labs Reviewed  WET PREP, GENITAL - Abnormal; Notable for the following:       Result Value   Clue Cells Wet Prep HPF POC PRESENT (*)    WBC, Wet Prep HPF POC MANY (*)    All other components within normal limits  LIPASE, BLOOD  COMPREHENSIVE METABOLIC PANEL  CBC  RAPID HIV SCREEN (HIV 1/2 AB+AG)  URINALYSIS, ROUTINE W REFLEX MICROSCOPIC  RPR  I-STAT BETA HCG BLOOD, ED ([facility], [facility], [facility] ONLY)  GC/CHLAMYDIA PROBE AMP ([place]) NOT AT [facility]    EKG  EKG Interpretation None       Radiology US Transvaginal Non-ob  Result Date: 04/24/2017 CLINICAL DATA:  Right-sided adnexal tenderness EXAM: TRANSABDOMINAL AND TRANSVAGINAL ULTRASOUND OF PELVIS DOPPLER ULTRASOUND OF OVARIES TECHNIQUE: Both transabdominal and transvaginal ultrasound examinations of the pelvis were performed. Transabdominal technique was performed for global imaging of the pelvis including uterus, ovaries, adnexal regions, and pelvic cul-de-sac. It was necessary to proceed with endovaginal exam following the transabdominal exam to visualize the uterus, ovaries, and adnexa. Color and duplex Doppler ultrasound was utilized to evaluate blood flow to the ovaries. COMPARISON:  None. FINDINGS: Uterus Measurements: 8.9 x 3.7 x 5.2 cm. Complex cystic lesion within the uterine cervix is likely a nabothian cyst. Example at 2.1 cm. Endometrium Thickness: Normal for age, 15  mm.  No focal abnormality visualized. Right ovary Measurements: 5.7 x 3.2 x 3.5 cm. Complex cystic lesion within the inferior portion the right ovary demonstrates peripheral hypervascularity. Measures 2.8 x 2.3 x 2.5 cm. Left ovary Measurements: 3.7 x 2.5 x 3.3 cm. Normal appearance/no adnexal mass. Pulsed Doppler evaluation of both ovaries demonstrates normal low-resistance arterial and venous waveforms. Normal color Doppler signal to both ovaries. Other findings No significant free fluid. IMPRESSION: 1. Right ovarian lesion is favored to represent a corpus luteal cyst, likely complicated by hemorrhage. As it is technically indeterminate, consider ultrasound follow-up 6-12 weeks. 2. No ovarian or adnexal torsion. 3. Presumed nabothian cyst. Electronically Signed   By: Jeronimo Greaves M.D.   On: 04/24/2017 12:59   US Pelvis Complete  Result Date: 04/24/2017 CLINICAL DATA:  Right-sided adnexal tenderness EXAM: TRANSABDOMINAL AND TRANSVAGINAL ULTRASOUND OF PELVIS DOPPLER ULTRASOUND OF OVARIES TECHNIQUE: Both transabdominal and transvaginal ultrasound examinations of the pelvis were performed. Transabdominal technique was performed for global imaging of the pelvis including uterus, ovaries, adnexal regions, and pelvic cul-de-sac. It was necessary to proceed with endovaginal exam following the transabdominal exam to visualize the uterus, ovaries, and adnexa. Color and duplex Doppler  ultrasound was utilized to evaluate blood flow to the ovaries. COMPARISON:  None. FINDINGS: Uterus Measurements: 8.9 x 3.7 x 5.2 cm. Complex cystic lesion within the uterine cervix is likely a nabothian cyst. Example at 2.1 cm. Endometrium Thickness: Normal for age, 15 mm.  No focal abnormality visualized. Right ovary Measurements: 5.7 x 3.2 x 3.5 cm. Complex cystic lesion within the inferior portion the right ovary demonstrates peripheral hypervascularity. Measures 2.8 x 2.3 x 2.5 cm. Left ovary Measurements: 3.7 x 2.5 x 3.3 cm. Normal  appearance/no adnexal mass. Pulsed Doppler evaluation of both ovaries demonstrates normal low-resistance arterial and venous waveforms. Normal color Doppler signal to both ovaries. Other findings No significant free fluid. IMPRESSION: 1. Right ovarian lesion is favored to represent a corpus luteal cyst, likely complicated by hemorrhage. As it is technically indeterminate, consider ultrasound follow-up 6-12 weeks. 2. No ovarian or adnexal torsion. 3. Presumed nabothian cyst. Electronically Signed   By: Jeronimo Greaves M.D.   On: 04/24/2017 12:59   [place] Art/ven Flow Abd Pelv Doppler  Result Date: 04/24/2017 CLINICAL DATA:  Right-sided adnexal tenderness EXAM: TRANSABDOMINAL AND TRANSVAGINAL ULTRASOUND OF PELVIS DOPPLER ULTRASOUND OF OVARIES TECHNIQUE: Both transabdominal and transvaginal ultrasound examinations of the pelvis were performed. Transabdominal technique was performed for global imaging of the pelvis including uterus, ovaries, adnexal regions, and pelvic cul-de-sac. It was necessary to proceed with endovaginal exam following the transabdominal exam to visualize the uterus, ovaries, and adnexa. Color and duplex Doppler ultrasound was utilized to evaluate blood flow to the ovaries. COMPARISON:  None. FINDINGS: Uterus Measurements: 8.9 x 3.7 x 5.2 cm. Complex cystic lesion within the uterine cervix is likely a nabothian cyst. Example at 2.1 cm. Endometrium Thickness: Normal for age, 15 mm.  No focal abnormality visualized. Right ovary Measurements: 5.7 x 3.2 x 3.5 cm. Complex cystic lesion within the inferior portion the right ovary demonstrates peripheral hypervascularity. Measures 2.8 x 2.3 x 2.5 cm. Left ovary Measurements: 3.7 x 2.5 x 3.3 cm. Normal appearance/no adnexal mass. Pulsed Doppler evaluation of both ovaries demonstrates normal low-resistance arterial and venous waveforms. Normal color Doppler signal to both ovaries. Other findings No significant free fluid. IMPRESSION: 1. Right ovarian lesion is  favored to represent a corpus luteal cyst, likely complicated by hemorrhage. As it is technically indeterminate, consider ultrasound follow-up 6-12 weeks. 2. No ovarian or adnexal torsion. 3. Presumed nabothian cyst. Electronically Signed   By: Jeronimo Greaves M.D.   On: 04/24/2017 12:59    Procedures Procedures (including critical care time)  Medications Ordered in ED Medications  morphine 4 MG/ML injection 4 mg (4 mg Intravenous Given 04/24/17 1106)  cefTRIAXone (ROCEPHIN) injection 250 mg (250 mg Intramuscular Given 04/24/17 1249)  azithromycin (ZITHROMAX) tablet 1,000 mg (1,000 mg Oral Given 04/24/17 1249)  lidocaine (PF) (XYLOCAINE) 1 % injection (1 mL  Given 04/24/17 1249)     Initial Impression / Assessment and Plan / ED Course  I have reviewed the triage vital signs and the nursing notes.  Pertinent labs & imaging results that were available during my care of the patient were reviewed by me and considered in my medical decision making (see chart for details).     BP 102/61   Pulse 70   Temp 98.1 F (36.7 C) (Oral)   Resp 16   Ht  (1.626 m)   Wt 86.2 kg (190 lb)   LMP 04/03/2017 (Approximate)   SpO2 100%   BMI 32.61 kg/m    Final Clinical Impressions(s) / ED  Diagnoses   Final diagnoses:  Ruptured ovarian cyst  BV (bacterial vaginosis)    New Prescriptions New Prescriptions   IBUPROFEN (ADVIL,MOTRIN) 600 MG TABLET    Take 1 tablet (600 mg total) by mouth every 6 (six) hours as needed for mild pain or moderate pain.   METRONIDAZOLE (FLAGYL) 500 MG TABLET    Take 1 tablet (500 mg total) by mouth 2 (two) times daily.   PROMETHAZINE (PHENERGAN) 25 MG TABLET    Take 1 tablet (25 mg total) by mouth every 6 (six) hours as needed for nausea or vomiting.   11:58 AM Patient here with acute onset of lower pelvic pain and right lower quadrant abdominal pain that started earlier today. She does have reproducible pain on pelvic examination. She is actively trying to get  pregnant with her husband. She does report intermittent spasm to her right lower quadrant with prior sexual activities but denies any recent sexual intercourse causing this pain. She will benefit from a transvaginal ultrasound to rule out TOA or ovarian torsion. She is not pregnant and therefore the suspicion for ectopic pregnancy. If her ultrasound is completely normal, and patient still having pain, may consider abdominal and pelvis CT scan to rule out appendicitis.  1:54 PM Patient does have tenderness to the right adnexal region on pelvic exam. Wet prep shows many WBC in the presence of clue cells. Patient will receive Rocephin and Zithromax to cover for potential STI. Her pregnancy test is negative. The remainder of her labs are reassuring.pelvic ultrasound demonstrates a right ovarian lesion represents a corpus luteum cyst, likely, could by hemorrhage. No ovarian or adnexal torsion. This finding is consistence with patient presenting complaint. Suspect pain from a ruptured ovarian cyst. Patient will be discharged home with antibiotic and her medication, and return precaution. She is hemodynamically stable. She will also be treated for bacterial vaginosis with Flagyl. Sheehan stent to avoid alcohol use while taking Flagyl. Currently I have low suspicion for appendicitis causing his symptoms however she understands appropriate reasons to return.   Fayrene Helper, PA-C 04/24/17 1359    Long, Arlyss Repress, MD 04/24/17 1535

## 2017-04-24 NOTE — ED Triage Notes (Signed)
Patient woke up this morning with sudden onset low abdominal cramping/spasms radiating to lower back and hips . Denies emesis or diarrhea , no urinary discomfort or fever .

## 2017-04-24 NOTE — ED Notes (Signed)
Urine needs recollect per lab

## 2017-04-25 LAB — GC/CHLAMYDIA PROBE AMP (~~LOC~~) NOT AT ARMC
CHLAMYDIA, DNA PROBE: NEGATIVE
NEISSERIA GONORRHEA: NEGATIVE

## 2017-04-25 LAB — RPR: RPR: NONREACTIVE

## 2017-06-08 ENCOUNTER — Other Ambulatory Visit: Payer: Self-pay | Admitting: Obstetrics and Gynecology

## 2017-06-20 LAB — OB RESULTS CONSOLE ABO/RH: RH TYPE: POSITIVE

## 2017-06-20 LAB — OB RESULTS CONSOLE HEPATITIS B SURFACE ANTIGEN: Hepatitis B Surface Ag: NEGATIVE

## 2017-06-20 LAB — OB RESULTS CONSOLE ANTIBODY SCREEN: Antibody Screen: NEGATIVE

## 2017-06-20 LAB — OB RESULTS CONSOLE HIV ANTIBODY (ROUTINE TESTING): HIV: NONREACTIVE

## 2017-06-20 LAB — OB RESULTS CONSOLE RUBELLA ANTIBODY, IGM: RUBELLA: IMMUNE

## 2017-07-10 NOTE — L&D Delivery Note (Signed)
Delivery Note  [facility] Pt   01/06/2018  Date of delivery: 01/07/18  Lori Donaldson, 32 y.o., @ [id number],  G1P0, who was admitted for IOL for PROM with possible light meconium. I was called to the room when she progressed +2 station in the second stage of labor.  She pushed for 30/min. Faculty was called for supervision.  Pt  delivered a viable infant, cephalic and restituted to the LOA position over an intact perineum.  A nuchal cord   was not identified. The baby was placed on maternal abdomen while initial step of NRP were perfmored (Dry, Stimulated, and warmed). Hat placed on baby for thermoregulation. Delayed cord clamping was performed for 2 minutes.  Cord double clamped and cut.  Cord cut by Father. Apgar scores were 8 and 9. The placenta delivered spontaneously, shultz, with a 3 vessel cord.  Inspection revealed 2nd degree perineal. An examination of the vaginal vault and cervix was free from lacerations. The uterus was firm, bleeding stable.  The repair was done under local anesthesia by Dr Mora ApplPinn.   Placenta was sent to LD and umbilical artery blood gas were not sent.  There were no complications during the procedure.  Mom and baby skin to skin following delivery. Left in stable condition.  Maternal Info: Anesthesia:Epidural Episiotomy: No Lacerations:  2nd degree perineal Suture Repair: Dr Mora ApplPinn performed the repair with  3-0 and 4-0 vicryl, hemostasis noted.  Est. Blood Loss (mL):  100  Newborn Info: Baby Sex: female Circumcision: No Babies Name: ??? APGAR (1 MIN): 8   APGAR (5 MINS): 9   APGAR (10 MINS):     Mom to postpartum.  Baby to Couplet care / Skin to Skin.   Lori Donaldson, CNM, NP-C 01/07/18 6:27 AM  6:20 AM

## 2017-09-17 DIAGNOSIS — Z362 Encounter for other antenatal screening follow-up: Secondary | ICD-10-CM | POA: Diagnosis not present

## 2017-10-01 DIAGNOSIS — Z3402 Encounter for supervision of normal first pregnancy, second trimester: Secondary | ICD-10-CM | POA: Diagnosis not present

## 2017-10-16 DIAGNOSIS — Z23 Encounter for immunization: Secondary | ICD-10-CM | POA: Diagnosis not present

## 2017-10-31 DIAGNOSIS — Z3A30 30 weeks gestation of pregnancy: Secondary | ICD-10-CM | POA: Diagnosis not present

## 2017-10-31 DIAGNOSIS — O26843 Uterine size-date discrepancy, third trimester: Secondary | ICD-10-CM | POA: Diagnosis not present

## 2017-12-06 DIAGNOSIS — F411 Generalized anxiety disorder: Secondary | ICD-10-CM | POA: Diagnosis not present

## 2017-12-14 DIAGNOSIS — Z3403 Encounter for supervision of normal first pregnancy, third trimester: Secondary | ICD-10-CM | POA: Diagnosis not present

## 2017-12-14 LAB — OB RESULTS CONSOLE GBS: GBS: NEGATIVE

## 2017-12-17 DIAGNOSIS — F411 Generalized anxiety disorder: Secondary | ICD-10-CM | POA: Diagnosis not present

## 2017-12-27 ENCOUNTER — Encounter (HOSPITAL_COMMUNITY): Payer: Self-pay | Admitting: *Deleted

## 2017-12-27 ENCOUNTER — Inpatient Hospital Stay (HOSPITAL_BASED_OUTPATIENT_CLINIC_OR_DEPARTMENT_OTHER): Payer: BLUE CROSS/BLUE SHIELD

## 2017-12-27 ENCOUNTER — Inpatient Hospital Stay (HOSPITAL_COMMUNITY)
Admission: AD | Admit: 2017-12-27 | Discharge: 2017-12-27 | Disposition: A | Discharge status: Home / Self Care | Payer: BLUE CROSS/BLUE SHIELD | Source: Ambulatory Visit | Attending: Obstetrics & Gynecology | Admitting: Obstetrics & Gynecology

## 2017-12-27 DIAGNOSIS — O1203 Gestational edema, third trimester: Secondary | ICD-10-CM | POA: Diagnosis not present

## 2017-12-27 DIAGNOSIS — M7989 Other specified soft tissue disorders: Secondary | ICD-10-CM | POA: Diagnosis not present

## 2017-12-27 DIAGNOSIS — O99333 Smoking (tobacco) complicating pregnancy, third trimester: Secondary | ICD-10-CM | POA: Insufficient documentation

## 2017-12-27 DIAGNOSIS — Z9049 Acquired absence of other specified parts of digestive tract: Secondary | ICD-10-CM | POA: Insufficient documentation

## 2017-12-27 DIAGNOSIS — Z79899 Other long term (current) drug therapy: Secondary | ICD-10-CM | POA: Insufficient documentation

## 2017-12-27 DIAGNOSIS — Z3A38 38 weeks gestation of pregnancy: Secondary | ICD-10-CM | POA: Insufficient documentation

## 2017-12-27 DIAGNOSIS — L539 Erythematous condition, unspecified: Secondary | ICD-10-CM | POA: Diagnosis not present

## 2017-12-27 DIAGNOSIS — Z7982 Long term (current) use of aspirin: Secondary | ICD-10-CM | POA: Insufficient documentation

## 2017-12-27 DIAGNOSIS — Z792 Long term (current) use of antibiotics: Secondary | ICD-10-CM | POA: Insufficient documentation

## 2017-12-27 DIAGNOSIS — Z79891 Long term (current) use of opiate analgesic: Secondary | ICD-10-CM | POA: Insufficient documentation

## 2017-12-27 DIAGNOSIS — Z9889 Other specified postprocedural states: Secondary | ICD-10-CM | POA: Insufficient documentation

## 2017-12-27 DIAGNOSIS — Z3689 Encounter for other specified antenatal screening: Secondary | ICD-10-CM

## 2017-12-27 NOTE — MAU Note (Signed)
Patient noticed 2in spot on anterior right leg last week that appears reddened and sore.  Seen in office today and sent by MD for evaluation to R/O DVT.

## 2017-12-27 NOTE — MAU Note (Signed)
Spoke with US tech.  They will be over shortly.

## 2017-12-27 NOTE — Progress Notes (Signed)
RLE venous duplex prelim: negative for DVT and SVT. Farrel DemarkJill Eunice, RDMS, RVT

## 2017-12-27 NOTE — MAU Provider Note (Signed)
History     CSN: [id number]  Arrival date and time: 12/27/17 1715   First Provider Initiated Contact with Patient 12/27/17 1743      Chief Complaint  Patient presents with  . Leg Swelling    R/O DVT; bruise   G1 @38  wks sent from office for LE doppler to r/o DVT. Reports edema and redness of right lower leg. Sx started 1 week ago. Area is slightly tender. Denies recent injury or insect bite. No pregnancy complaints. Good FM today.  OB History    Gravida  1   Para      Term      Preterm      AB      Living        SAB      TAB      Ectopic      Multiple      Live Births              History reviewed. No pertinent past medical history.  Past Surgical History:  Procedure Laterality Date  . CHOLECYSTECTOMY N/A 03/16/2015   Procedure: LAPAROSCOPIC CONVERTED TO OPEN CHOLECYSTECTOMY;  Surgeon: Claud Kelp, MD;  Location: [facility];  Service: General;  Laterality: N/A;    History reviewed. No pertinent family history.  Social History   Tobacco Use  . Smoking status: Light Tobacco Smoker  . Smokeless tobacco: Never Used  . Tobacco comment: rare, on occasion social  Substance Use Topics  . Alcohol use: Not Currently  . Drug use: No    Allergies: No Known Allergies  Medications Prior to Admission  Medication Sig Dispense Refill Last Dose  . Acetaminophen-Caff-Pyrilamine (MIDOL COMPLETE PO) Take 2 tablets by mouth 2 (two) times daily as needed (FOR CRAMPS).   Past Month at Unknown time  . aspirin-acetaminophen-caffeine (EXCEDRIN MIGRAINE) 250-250-65 MG per tablet Take 2 tablets by mouth every 6 (six) hours as needed for headache.   UNKNOWN  . ibuprofen (ADVIL,MOTRIN) 600 MG tablet Take 1 tablet (600 mg total) by mouth every 6 (six) hours as needed for mild pain or moderate pain. 30 tablet 0   . Menthol, Topical Analgesic, (ICY HOT BACK) 5 % PADS Apply 1 patch topically daily as needed (back pain).   UNKNOWN  . Menthol-Camphor (TIGER BALM ARTHRITIS RUB EX)  Apply 1 application topically 2 (two) times daily as needed (muscle pain).   UNKNOWN  . methocarbamol (ROBAXIN) 500 MG tablet Take 2 tablets (1,000 mg total) by mouth every 8 (eight) hours as needed for muscle spasms. 30 tablet 0   . metroNIDAZOLE (FLAGYL) 500 MG tablet Take 1 tablet (500 mg total) by mouth 2 (two) times daily. 14 tablet 0   . oxyCODONE-acetaminophen (PERCOCET/ROXICET) 5-325 MG per tablet Take 1-2 tablets by mouth every 6 (six) hours as needed for moderate pain. 40 tablet 0   . promethazine (PHENERGAN) 25 MG tablet Take 1 tablet (25 mg total) by mouth every 6 (six) hours as needed for nausea or vomiting. 20 tablet 0     Review of Systems  Cardiovascular: Positive for leg swelling.  Gastrointestinal: Negative for abdominal pain.  Genitourinary: Negative for vaginal bleeding.  Skin: Positive for color change.   Physical Exam   Blood pressure 125/73, pulse 78, temperature 98.1 F (36.7 C), temperature source Oral, resp. rate 16, weight 210 lb 4 oz (95.4 kg), last menstrual period 04/03/2017, SpO2 96 %.  Physical Exam  Constitutional: She is oriented to person, place, and time. She appears  well-developed and well-nourished. No distress.  HENT:  Head: Normocephalic and atraumatic.  Neck: Normal range of motion.  Cardiovascular: Normal rate.  Respiratory: Effort normal.  Musculoskeletal: Normal range of motion. She exhibits no edema or deformity.  Homans negative Rt and Lt calf measure 40cm  Neurological: She is alert and oriented to person, place, and time.  Skin: Skin is warm and dry. There is erythema.  Rt LE with 6x4cm erythemic area to lateral calf 2x2 cm round erythemic area superior to above  EFM: 135 bpm, mod variability, + accels, no decels Toco: irritability  No results found for this or any previous visit (from the past 24 hour(s)).  MAU Course  Procedures  MDM Doppler ordered by Dr. Charlotta Newtonzan. Doppler negative. Mngt per Dr. Charlotta Newtonzan. I was requested to place  clinical impression as MD was unavailable in surgery.  Assessment and Plan   1. [redacted] weeks gestation of pregnancy   2. NST (non-stress test) reactive   3. Erythema of lower extremity    Discharged home  Allergies as of 12/27/2017   No Known Allergies     Medication List    STOP taking these medications   aspirin-acetaminophen-caffeine 250-250-65 MG tablet Commonly known as:  EXCEDRIN MIGRAINE   ibuprofen 600 MG tablet Commonly known as:  ADVIL,MOTRIN   ICY HOT BACK 5 % Pads Generic drug:  Menthol (Topical Analgesic)   methocarbamol 500 MG tablet Commonly known as:  ROBAXIN   metroNIDAZOLE 500 MG tablet Commonly known as:  FLAGYL   MIDOL COMPLETE PO   oxyCODONE-acetaminophen 5-325 MG tablet Commonly known as:  PERCOCET/ROXICET   promethazine 25 MG tablet Commonly known as:  PHENERGAN   [employer] BALM ARTHRITIS RUB EX      Donette LarryMelanie Yaslin Kirtley, CNM 12/27/2017, 5:53 PM

## 2017-12-31 DIAGNOSIS — F411 Generalized anxiety disorder: Secondary | ICD-10-CM | POA: Diagnosis not present

## 2018-01-06 ENCOUNTER — Other Ambulatory Visit: Payer: Self-pay

## 2018-01-06 ENCOUNTER — Inpatient Hospital Stay (HOSPITAL_COMMUNITY): Payer: BLUE CROSS/BLUE SHIELD | Admitting: Anesthesiology

## 2018-01-06 ENCOUNTER — Encounter (HOSPITAL_COMMUNITY): Payer: Self-pay | Admitting: Emergency Medicine

## 2018-01-06 ENCOUNTER — Inpatient Hospital Stay (HOSPITAL_COMMUNITY)
Admission: AD | Admit: 2018-01-06 | Discharge: 2018-01-09 | DRG: 806 | Disposition: A | Discharge status: Home / Self Care | Payer: BLUE CROSS/BLUE SHIELD | Attending: Obstetrics & Gynecology | Admitting: Obstetrics & Gynecology

## 2018-01-06 DIAGNOSIS — F17200 Nicotine dependence, unspecified, uncomplicated: Secondary | ICD-10-CM | POA: Diagnosis present

## 2018-01-06 DIAGNOSIS — Z23 Encounter for immunization: Secondary | ICD-10-CM | POA: Diagnosis not present

## 2018-01-06 DIAGNOSIS — K8000 Calculus of gallbladder with acute cholecystitis without obstruction: Secondary | ICD-10-CM | POA: Diagnosis not present

## 2018-01-06 DIAGNOSIS — O9962 Diseases of the digestive system complicating childbirth: Secondary | ICD-10-CM | POA: Diagnosis not present

## 2018-01-06 DIAGNOSIS — O99214 Obesity complicating childbirth: Secondary | ICD-10-CM | POA: Diagnosis not present

## 2018-01-06 DIAGNOSIS — Z3A39 39 weeks gestation of pregnancy: Secondary | ICD-10-CM

## 2018-01-06 DIAGNOSIS — Z3A40 40 weeks gestation of pregnancy: Secondary | ICD-10-CM | POA: Diagnosis not present

## 2018-01-06 DIAGNOSIS — O4290 Premature rupture of membranes, unspecified as to length of time between rupture and onset of labor, unspecified weeks of gestation: Secondary | ICD-10-CM | POA: Diagnosis present

## 2018-01-06 DIAGNOSIS — K429 Umbilical hernia without obstruction or gangrene: Secondary | ICD-10-CM | POA: Diagnosis not present

## 2018-01-06 DIAGNOSIS — Q170 Accessory auricle: Secondary | ICD-10-CM | POA: Diagnosis not present

## 2018-01-06 DIAGNOSIS — O4292 Full-term premature rupture of membranes, unspecified as to length of time between rupture and onset of labor: Secondary | ICD-10-CM | POA: Diagnosis not present

## 2018-01-06 DIAGNOSIS — O99334 Smoking (tobacco) complicating childbirth: Secondary | ICD-10-CM | POA: Diagnosis not present

## 2018-01-06 DIAGNOSIS — R011 Cardiac murmur, unspecified: Secondary | ICD-10-CM | POA: Diagnosis not present

## 2018-01-06 HISTORY — DX: Cholecystitis, unspecified: K81.9

## 2018-01-06 HISTORY — DX: Anxiety disorder, unspecified: F41.9

## 2018-01-06 HISTORY — DX: Calculus of gallbladder without cholecystitis without obstruction: K80.20

## 2018-01-06 HISTORY — DX: Acquired absence of other specified parts of digestive tract: Z90.49

## 2018-01-06 HISTORY — DX: Unspecified ovarian cyst, unspecified side: N83.209

## 2018-01-06 LAB — CBC
HEMATOCRIT: 34.1 % — AB (ref 36.0–46.0)
HEMOGLOBIN: 11.5 g/dL — AB (ref 12.0–15.0)
MCH: 29.6 pg (ref 26.0–34.0)
MCHC: 33.7 g/dL (ref 30.0–36.0)
MCV: 87.9 fL (ref 78.0–100.0)
Platelets: 305 10*3/uL (ref 150–400)
RBC: 3.88 MIL/uL (ref 3.87–5.11)
RDW: 13.3 % (ref 11.5–15.5)
WBC: 9.9 10*3/uL (ref 4.0–10.5)

## 2018-01-06 LAB — TYPE AND SCREEN
ABO/RH(D): B POS
ANTIBODY SCREEN: NEGATIVE

## 2018-01-06 LAB — POCT FERN TEST: POCT Fern Test: POSITIVE

## 2018-01-06 LAB — ABO/RH: ABO/RH(D): B POS

## 2018-01-06 MED ORDER — LIDOCAINE HCL (PF) 1 % IJ SOLN
INTRAMUSCULAR | Status: DC | PRN
  Administered 2018-01-06: 5 mL via EPIDURAL

## 2018-01-06 MED ORDER — LACTATED RINGERS IV SOLN
500.0000 mL | Freq: Once | INTRAVENOUS | Status: AC
  Administered 2018-01-06: 500 mL via INTRAVENOUS

## 2018-01-06 MED ORDER — OXYCODONE-ACETAMINOPHEN 5-325 MG PO TABS: 1.0000 | ORAL_TABLET | ORAL | Status: DC | PRN

## 2018-01-06 MED ORDER — OXYCODONE-ACETAMINOPHEN 5-325 MG PO TABS: 2.0000 | ORAL_TABLET | ORAL | Status: DC | PRN

## 2018-01-06 MED ORDER — PHENYLEPHRINE 40 MCG/ML (10ML) SYRINGE FOR IV PUSH (FOR BLOOD PRESSURE SUPPORT): 80.0000 ug | PREFILLED_SYRINGE | INTRAVENOUS | Status: DC | PRN

## 2018-01-06 MED ORDER — EPHEDRINE 5 MG/ML INJ
10.0000 mg | INTRAVENOUS | Status: DC | PRN
  Filled 2018-01-06: qty 2

## 2018-01-06 MED ORDER — ONDANSETRON HCL 4 MG/2ML IJ SOLN
4.0000 mg | Freq: Four times a day (QID) | INTRAMUSCULAR | Status: DC | PRN
  Administered 2018-01-06 – 2018-01-07 (×2): 4 mg via INTRAVENOUS
  Filled 2018-01-06 (×2): qty 2

## 2018-01-06 MED ORDER — DIPHENHYDRAMINE HCL 50 MG/ML IJ SOLN: 12.5000 mg | INTRAMUSCULAR | Status: DC | PRN

## 2018-01-06 MED ORDER — OXYTOCIN 40 UNITS IN LACTATED RINGERS INFUSION - SIMPLE MED
1.0000 m[IU]/min | INTRAVENOUS | Status: DC
  Administered 2018-01-06: 2 m[IU]/min via INTRAVENOUS
  Filled 2018-01-06: qty 1000

## 2018-01-06 MED ORDER — SOD CITRATE-CITRIC ACID 500-334 MG/5ML PO SOLN: 30.0000 mL | ORAL | Status: DC | PRN

## 2018-01-06 MED ORDER — EPHEDRINE 5 MG/ML INJ: 10.0000 mg | INTRAVENOUS | Status: DC | PRN

## 2018-01-06 MED ORDER — TERBUTALINE SULFATE 1 MG/ML IJ SOLN
0.2500 mg | Freq: Once | INTRAMUSCULAR | Status: DC | PRN
  Filled 2018-01-06: qty 1

## 2018-01-06 MED ORDER — FENTANYL 2.5 MCG/ML BUPIVACAINE 1/10 % EPIDURAL INFUSION (WH - ANES)
14.0000 mL/h | INTRAMUSCULAR | Status: DC | PRN
  Administered 2018-01-06: 14 mL/h via EPIDURAL
  Filled 2018-01-06: qty 100

## 2018-01-06 MED ORDER — OXYTOCIN 40 UNITS IN LACTATED RINGERS INFUSION - SIMPLE MED
2.5000 [IU]/h | INTRAVENOUS | Status: DC
  Administered 2018-01-07: 2.5 [IU]/h via INTRAVENOUS

## 2018-01-06 MED ORDER — LACTATED RINGERS IV SOLN: 500.0000 mL | INTRAVENOUS | Status: DC | PRN

## 2018-01-06 MED ORDER — PHENYLEPHRINE 40 MCG/ML (10ML) SYRINGE FOR IV PUSH (FOR BLOOD PRESSURE SUPPORT)
80.0000 ug | PREFILLED_SYRINGE | INTRAVENOUS | Status: DC | PRN
  Filled 2018-01-06: qty 5

## 2018-01-06 MED ORDER — LACTATED RINGERS IV SOLN
INTRAVENOUS | Status: DC
  Administered 2018-01-06 (×2): via INTRAVENOUS

## 2018-01-06 MED ORDER — ACETAMINOPHEN 325 MG PO TABS: 650.0000 mg | ORAL_TABLET | ORAL | Status: DC | PRN

## 2018-01-06 MED ORDER — OXYTOCIN BOLUS FROM INFUSION
500.0000 mL | Freq: Once | INTRAVENOUS | Status: AC
  Administered 2018-01-07: 500 mL via INTRAVENOUS

## 2018-01-06 MED ORDER — PHENYLEPHRINE 40 MCG/ML (10ML) SYRINGE FOR IV PUSH (FOR BLOOD PRESSURE SUPPORT)
80.0000 ug | PREFILLED_SYRINGE | INTRAVENOUS | Status: DC | PRN
  Filled 2018-01-06: qty 10
  Filled 2018-01-06: qty 5

## 2018-01-06 MED ORDER — LACTATED RINGERS IV SOLN: 500.0000 mL | Freq: Once | INTRAVENOUS | Status: DC

## 2018-01-06 MED ORDER — LIDOCAINE HCL (PF) 1 % IJ SOLN
30.0000 mL | INTRAMUSCULAR | Status: DC | PRN
  Filled 2018-01-06: qty 30

## 2018-01-06 NOTE — MAU Note (Signed)
0900 had a small gush, has been gushing since, clear fluid  Having some pressure, but not ctx. No vaginal bleeding, +FM

## 2018-01-06 NOTE — Progress Notes (Signed)
Labor Progress Note  Subjective: Lori Donaldson, 32 y.o., G1P0, with an IUP @ 2543w6d, presented for PROM at 0745 on 06/30 with possible light meconium. Pt lying in bed with family at bedside. Pt endorses starting to feel irregular contractions. Pt also endorses she wants the epidural due to a low pain threshold as soon as contractions start to pick up. Pt in NAD.  Patient Active Problem List   Diagnosis Date Noted  . PROM (premature rupture of membranes) 01/06/2018  . Cholecystitis, acute with cholelithiasis 03/16/2015     Objective: BP 134/85   Pulse 81   Temp 98.6 F (37 C) (Oral)   Resp 18   Ht 5\' 4"  (1.626 m)   Wt 96.6 kg (213 lb)   LMP 04/03/2017 (Approximate)   BMI 36.56 kg/m  No intake/output data recorded. No intake/output data recorded. NST: FHR baseline 130 bpm, Variability: moderate, Accelerations:present, Decelerations:  Absent= Cat 1/Reactive CTX:  irregular, every 7-10 minutes, mild to palpate  SVE:  Dilation: 1.5 Effacement (%): 50 Station: -3 Exam by:: J. Dameion Briles CNM  Last Cervical check was at 1230 on 01/06/2018  Pitocin at () mUn/min (Not started yet)  Assessment:  Lori Donaldson, 32 y.o., G1P0, with an IUP @ 5843w6d, presented for PROM at 0745 on 06/30 with possible light meconium. Patient Active Problem List   Diagnosis Date Noted  . PROM (premature rupture of membranes) 01/06/2018  . Cholecystitis, acute with cholelithiasis 03/16/2015  IOL d/t PROM (Havent started IOL due to PROM yet). Discussed with pt about starting low dose pitocin at 8pm per Dr Su Hiltoberts request. Pt verbalized understanding and agrees to plan of care with verbalizing knowledge or risk and benefits. Pt denies further questions. NICHD: Category 1. Pt in stable condition.   Membranes:  SROM x 8 hrs, no s/s of infection  Induction:    Pitocin at ( ) mUn/min (Not started yet)   Pain management:                Epidural placement:  at (Has not been placed yet) on 06/30  GBS  Negative  Plan: Continue labor plan Continuous/intermittent monitoring Rest/Ambulate/Frequent position changes to facilitate fetal rotation and descent. Pt to use gentle nipple stimulation and birth ball with pelvic rocking to induce labor for expected management.  Induction: Plan to start low dose pitocin protocol at 8pm if no natural progression.  Will reassess with pt progress at 8pm or earlier if necessary  Plan to start pitocin per protocol at low dose at 8pm.  Amnioinfusion with bolus of 300 cc, then 150 cc/hr if indicated by repetitive variables.  Plan to start Unasyn if pt spikes fever.  Anticipate labor progression and vaginal delivery.   Md Su HiltRoberts aware of plan and verbalized agreement.   Dale DurhamJade Amiir Heckard, NP-C, CNM, MSN 01/06/2018. 4:08 PM

## 2018-01-06 NOTE — Progress Notes (Signed)
Labor Progress Note  Subjective: Lori Donaldson, 32 y.o., G1P0, with an IUP @ 1975w6d, presented for PROM at 0745 on 06/30 with possible light meconium. Pt lying in bed with family at bedside. Pt endorses still  feeling irregular contractions that feel like menses cramp and are tolerable. Pt also endorses she wants the epidural now before administering the epidural due to a low pain threshold.  Pt in NAD.  Patient Active Problem List   Diagnosis Date Noted  . PROM (premature rupture of membranes) 01/06/2018  . Cholecystitis, acute with cholelithiasis 03/16/2015     Objective: BP (!) 146/81   Pulse 89   Temp 98.7 F (37.1 C) (Oral)   Resp 18   Ht 5\' 4"  (1.626 m)   Wt 96.6 kg (213 lb)   LMP 04/03/2017 (Approximate)   BMI 36.56 kg/m  No intake/output data recorded. No intake/output data recorded. NST: FHR baseline 130 bpm, Variability: moderate, Accelerations:present, Decelerations:  Absent= Cat 1/Reactive CTX:  irregular, every 7-10 minutes, mild to palpate  SVE:  Dilation: 1.5 Effacement (%): 50 Station: -3 Exam by:: J. Ziggy Chanthavong CNM  Last Cervical check was at 1230 on 01/06/2018  Pitocin at () mUn/min (RN aware to start but Not started yet)  Assessment:  Lori BootsJessica Marie Supan, 31 y.o., G1P0, with an IUP @ 4175w6d, presented for PROM at 0745 on 06/30 with possible light meconium. Patient Active Problem List   Diagnosis Date Noted  . PROM (premature rupture of membranes) 01/06/2018  . Cholecystitis, acute with cholelithiasis 03/16/2015  IOL d/t PROM (Havent started IOL due to PROM yet). Discussed with pt about starting  pitocin now per Dr Pilar GrammesPinns request. Pt verbalized understanding and agrees to plan of care with verbalizing knowledge or risk and benefits. Pt denies further questions. NICHD: Category 1. Pt in stable condition.   Membranes:  SROM x 13 hrs, no s/s of infection  Induction:    Pitocin at (2) mUn/min (Not started yet waiting for epidural placement)   Pain  management:                Epidural placement: at (Has not been placed yet, bolus running) on 06/30  GBS Negative  Plan: Continue labor plan Continuous/intermittent monitoring Rest/Frequent position changes to facilitate fetal rotation and descent..  Induction: Plan to start pitocin protocol at 8pm if no natural progression.  Will reassess with pt progress at 12 pm or earlier if necessary  Plan to start pitocin per protocol 2X2 protocol now with placement of epidural  Amnioinfusion with bolus of 300 cc, then 150 cc/hr if indicated by repetitive variables.  Plan to start Unasyn if pt spikes fever.  Anticipate labor progression and vaginal delivery.   [facility] and is aware of plan and verbalized agreement.   Dale DurhamJade Dannetta Lekas, NP-C, CNM, MSN 01/06/2018. 8:45 PM

## 2018-01-06 NOTE — Anesthesia Procedure Notes (Addendum)
Epidural Patient location during procedure: OB Start time: 01/06/2018 8:54 PM End time: 01/06/2018 9:21 PM  Staffing Anesthesiologist: Trevor IhaHouser, Teague Goynes A, MD Performed: anesthesiologist   Preanesthetic Checklist Completed: patient identified, site marked, surgical consent, pre-op evaluation, timeout performed, IV checked, risks and benefits discussed and monitors and equipment checked  Epidural Patient position: sitting Prep: site prepped and draped and DuraPrep Patient monitoring: continuous pulse ox and blood pressure Approach: midline Location: L3-L4 Injection technique: LOR air  Needle:  Needle type: Tuohy  Needle gauge: 17 G Needle length: 9 cm and 9 Needle insertion depth: 5 cm cm Catheter type: closed end flexible Catheter size: 19 Gauge Catheter at skin depth: 10 cm Test dose: negative  Assessment Events: blood not aspirated, injection not painful, no injection resistance, negative IV test and no paresthesia

## 2018-01-06 NOTE — H&P (Addendum)
Lori Donaldson is a 32 y.o. female, G1P0000, IUP at 39.6 weeks, presenting for PROM. Pt received PNC with [facility] Charlotta Newton.  Possible light meconium per pt. First gush of fluid felt at 0730 and saw fluid tinted like yellow-brown, then second gush was clearer, and last gush was clear.  Pt endorse + Fm. Denies vaginal bleeding. Denies feeling cxt's.  Patient Active Problem List   Diagnosis Date Noted  . Cholecystitis, acute with cholelithiasis 03/16/2015    Prenatal Problem: Childhood asthma (No meds) Anxiety with no diagnosis in counseling (not medicated)  Acid reflux  Prenatal meds: No medications prior to admission.  Random PNV Tums  No past medical history on file.   No current facility-administered medications on file prior to encounter.    No current outpatient medications on file prior to encounter.     No Known Allergies  History of present pregnancy: Pt Info/Preference:  Screening/Consents:  Labs:   EDD: Estimated Date of Delivery: 01/07/18  Establised: Patient's last menstrual period was 04/03/2017 (approximate).  Anatomy Scan: Date: feburary 2019 Placenta Location: Posterior  Genetic Screen: Panoroma:No AFP:  First Tri: Quad: Neg  Office: [facility]           Md: Dr Charlotta Newton First PNV: 06/20/2017 Blood Type  B+  Language: English Last PNV: 01/03/2018 Rhogam    Flu Vaccine:  Done   Antibody  Neg  TDaP vaccine Done   GTT: Early: N/A Third Trimester: Normal  Feeding Plan: Breast BTL: No Rubella:  Non-Immune  Contraception: undecided VBAC: No RPR: Non Reactive (10/16 1104)   Circumcision: BG   HBsAg:  Neg  Pediatrician:  April Gay with [facility]   HIV: NON REACTIVE (10/16 1104)   Prenatal Classes: No Additional [place]: No GBS:  Neg      Chlamydia: Neg    MFM Referral/Consult:  GC: Neg  Support Person: Husabnd Janyth Pupa)   PAP: 2018 normal  Pain Management: Epidural Neonatologist Referral:  Hgb Electrophoresis:  N/A  Birth Plan: No   Hgb NOB: ???   28W: 12.6   OB History    Gravida  1   Para      Term      Preterm      AB      Living        SAB      TAB      Ectopic      Multiple      Live Births             No past medical history on file. Past Surgical History:  Procedure Laterality Date  . CHOLECYSTECTOMY N/A 03/16/2015   Procedure: LAPAROSCOPIC CONVERTED TO OPEN CHOLECYSTECTOMY;  Surgeon: Claud Kelp, MD;  Location: [facility];  Service: General;  Laterality: N/A;   Family History: family history is not on file. Social History:  reports that she has been smoking.  She has never used smokeless tobacco. She reports that she drank alcohol. She reports that she does not use drugs.   Prenatal Transfer Tool  Maternal Diabetes: No Genetic Screening: Normal Maternal Ultrasounds/Referrals: Normal Fetal Ultrasounds or other Referrals:  None Maternal Substance Abuse:  No Significant Maternal Medications:  None Significant Maternal Lab Results: None  ROS:  Review of Systems  All other systems reviewed and are negative.    Physical Exam: BP 126/75 (BP Location: Right Arm)   Pulse 79   Temp 98.6 F (37 C) (Oral)   Resp 18   Ht 5\' 4"  (1.626 m)  Wt 96.6 kg (213 lb)   LMP 04/03/2017 (Approximate)   BMI 36.56 kg/m   Physical Exam  Constitutional: She is well-developed, well-nourished, and in no distress.  HENT:  Head: Normocephalic and atraumatic.  Eyes: Pupils are equal, round, and reactive to light. Conjunctivae are normal.  Neck: Normal range of motion. Neck supple.  Cardiovascular: Normal rate and regular rhythm.  Pulmonary/Chest: Effort normal and breath sounds normal.  Abdominal: Soft. Bowel sounds are normal.  Genitourinary: Cervix normal.  Genitourinary Comments: Pelvic: Adequate for vaginal delivery, gravida uterus equal to dates. Non-tender.   Neurological: She is alert. Gait normal.  Skin: Skin is warm.  Psychiatric: Affect normal.  Nursing note and vitals reviewed.    NST: FHR baseline 130s bpm, Variability:  moderate, Accelerations:present, Decelerations:  Absent= Cat 1/Reactive UC:   none SVE:   Dilation: 2 Effacement (%): 50, 60 Station: -3 Exam by:: Janeth Rasehristina Robinson rNC, vertex verified by fetal sutures.  Leopold's: Position Vertex, EFW 7.5lbs via leopold's.  Pelvic: Never been proven SVE: 1.5/50/-3, sutures verified via vaginal exam and bedside US.   Labs: Results for orders placed or performed during the hospital encounter of 01/06/18 (from the past 24 hour(s))  POCT fern test     Status: None   Collection Time: 01/06/18 12:36 PM  Result Value Ref Range   POCT Fern Test Positive = ruptured amniotic membanes     Imaging:  No results found.  MAU Course: Orders Placed This Encounter  Procedures  . Contraction - monitoring  . External fetal heart monitoring  . Vaginal exam  . POCT fern test   No orders of the defined types were placed in this encounter.   Assessment/Plan: Lori Donaldson is a 32 y.o. female, G1P0000, IUP at 39.6 weeks, presenting for PROM, possible light meconium, gbs-,  I discussed with patient risks, benefits and alternatives of labor induction including higher risk of cesarean delivery compared to spontaneous labor.  We discussed risks of induction agents including effects on fetal heart beat, contraction pattern and need for close monitoring.  Patient expressed understanding of all this and desired to proceed with expected management.  Risks and benefits of induction were reviewed, including failure of method, prolonged labor, need for further intervention, risk of cesarean.  Patient and family verbalized understanding and denies any further questions at this time.If and when Pt and family wish to proceed with induction process. Discussed induction options of Cytotec, foley bulb, AROM, and pitocin were reviewed as well as risks and benefits with use of each discussed..   FWB: Cat 1 Fetal Tracing. Pt in stable condition.   Plan: Admit to Birthing Suite  per consult with Dr Su Hiltoberts Routine [facility] orders Pt to use gentle nipple stimulation and birth ball with pelvic rocking to induce labor for expected management.  Will proceed with expected management, if no cervical dilation will start induction with Cytotec or pitocin depending of bishop score at time of assessment.  Pain med/epidural prn Anticipate labor progression   Rigel Filsinger NP-C, CNM, MSN 01/06/2018, 12:58 PM

## 2018-01-06 NOTE — Anesthesia Pain Management Evaluation Note (Signed)
  CRNA Pain Management Visit Note  Patient: Lori Donaldson, 32 y.o., female  "Hello I am [facility]. We have an anesthesia team available at all times to provide care throughout the hospital, including epidural management and anesthesia for C-section. I don't know your plan for the delivery whether it a natural birth, water birth, IV sedation, nitrous supplementation, doula or epidural, but we want to meet your pain goals."   1.Was your pain managed to your expectations on prior hospitalizations?   Yes   2.What is your expectation for pain management during this hospitalization?     Epidural  3.How can we help you reach that goal? Support prn  Record the patient's initial score and the patient's pain goal.   Pain: 1  Pain Goal: 8 [facility] wants you to be able to say your pain was always managed very well.  [facility] Center[facility],Lori Donaldson 01/06/2018

## 2018-01-06 NOTE — Anesthesia Preprocedure Evaluation (Addendum)
Anesthesia Evaluation  Patient identified by MRN, date of birth, ID band Patient awake    Reviewed: Allergy & Precautions, NPO status , Patient's Chart, lab work & pertinent test results  Airway Mallampati: II  TM Distance: >3 FB Neck ROM: Full    Dental no notable dental hx. (+) Teeth Intact, Caps   Pulmonary Current Smoker,    Pulmonary exam normal breath sounds clear to auscultation       Cardiovascular negative cardio ROS Normal cardiovascular exam Rhythm:Regular Rate:Normal     Neuro/Psych Anxiety negative neurological ROS     GI/Hepatic   Endo/Other  Morbid obesity  Renal/GU      Musculoskeletal   Abdominal (+) + obese,   Peds  Hematology   Anesthesia Other Findings   Reproductive/Obstetrics (+) Pregnancy                             Lab Results  Component Value Date   WBC 9.9 01/06/2018   HGB 11.5 (L) 01/06/2018   HCT 34.1 (L) 01/06/2018   MCV 87.9 01/06/2018   PLT 305 01/06/2018    Anesthesia Physical Anesthesia Plan  ASA: III  Anesthesia Plan: Epidural   Post-op Pain Management:    Induction:   PONV Risk Score and Plan:   Airway Management Planned:   Additional Equipment:   Intra-op Plan:   Post-operative Plan:   Informed Consent: I have reviewed the patients History and Physical, chart, labs and discussed the procedure including the risks, benefits and alternatives for the proposed anesthesia with the patient or authorized representative who has indicated his/her understanding and acceptance.     Plan Discussed with:   Anesthesia Plan Comments:        Anesthesia Quick Evaluation

## 2018-01-07 ENCOUNTER — Encounter (HOSPITAL_COMMUNITY): Payer: Self-pay | Admitting: *Deleted

## 2018-01-07 LAB — RPR: RPR: NONREACTIVE

## 2018-01-07 MED ORDER — WITCH HAZEL-GLYCERIN EX PADS: 1.0000 "application " | MEDICATED_PAD | CUTANEOUS | Status: DC | PRN

## 2018-01-07 MED ORDER — IBUPROFEN 600 MG PO TABS
600.0000 mg | ORAL_TABLET | Freq: Four times a day (QID) | ORAL | Status: DC
  Administered 2018-01-07 – 2018-01-09 (×8): 600 mg via ORAL
  Filled 2018-01-07 (×9): qty 1

## 2018-01-07 MED ORDER — BENZOCAINE-MENTHOL 20-0.5 % EX AERO
1.0000 "application " | INHALATION_SPRAY | CUTANEOUS | Status: DC | PRN
  Administered 2018-01-07: 1 via TOPICAL
  Filled 2018-01-07: qty 56

## 2018-01-07 MED ORDER — ONDANSETRON HCL 4 MG PO TABS
4.0000 mg | ORAL_TABLET | ORAL | Status: DC | PRN
Start: 2018-01-07 — End: 2018-01-09

## 2018-01-07 MED ORDER — SIMETHICONE 80 MG PO CHEW: 80.0000 mg | CHEWABLE_TABLET | ORAL | Status: DC | PRN

## 2018-01-07 MED ORDER — DIPHENHYDRAMINE HCL 25 MG PO CAPS
25.0000 mg | ORAL_CAPSULE | Freq: Four times a day (QID) | ORAL | Status: DC | PRN
Start: 2018-01-07 — End: 2018-01-09

## 2018-01-07 MED ORDER — DIBUCAINE 1 % RE OINT: 1.0000 "application " | TOPICAL_OINTMENT | RECTAL | Status: DC | PRN

## 2018-01-07 MED ORDER — COCONUT OIL OIL: 1.0000 "application " | TOPICAL_OIL | Status: DC | PRN

## 2018-01-07 MED ORDER — PRENATAL MULTIVITAMIN CH: 1.0000 | ORAL_TABLET | Freq: Every day | ORAL | Status: DC

## 2018-01-07 MED ORDER — TETANUS-DIPHTH-ACELL PERTUSSIS 5-2.5-18.5 LF-MCG/0.5 IM SUSP: 0.5000 mL | Freq: Once | INTRAMUSCULAR | Status: DC

## 2018-01-07 MED ORDER — ZOLPIDEM TARTRATE 5 MG PO TABS: 5.0000 mg | ORAL_TABLET | Freq: Every evening | ORAL | Status: DC | PRN

## 2018-01-07 MED ORDER — CALCIUM CARBONATE ANTACID 500 MG PO CHEW: 1.0000 | CHEWABLE_TABLET | Freq: Four times a day (QID) | ORAL | Status: DC | PRN

## 2018-01-07 MED ORDER — SENNOSIDES-DOCUSATE SODIUM 8.6-50 MG PO TABS
2.0000 | ORAL_TABLET | ORAL | Status: DC
  Administered 2018-01-08: 2 via ORAL
  Filled 2018-01-07 (×2): qty 2

## 2018-01-07 MED ORDER — PRENATAL MULTIVITAMIN CH
1.0000 | ORAL_TABLET | Freq: Every day | ORAL | Status: DC
  Administered 2018-01-07 – 2018-01-08 (×2): 1 via ORAL
  Filled 2018-01-07 (×2): qty 1

## 2018-01-07 MED ORDER — ACETAMINOPHEN 325 MG PO TABS: 650.0000 mg | ORAL_TABLET | ORAL | Status: DC | PRN

## 2018-01-07 MED ORDER — ONDANSETRON HCL 4 MG/2ML IJ SOLN: 4.0000 mg | INTRAMUSCULAR | Status: DC | PRN

## 2018-01-07 NOTE — Progress Notes (Signed)
Labor Progress Note  Subjective: Lori Donaldson, 32 y.o., G1P0, with an IUP @ 9324w6d, presented for PROM at 0745 on 06/30 with possible light meconium. Pt lying in bed sleeping with partner at bedside. Pt endorses not feeling any contractions post getting the epidural. Pt in NAD.  Patient Active Problem List   Diagnosis Date Noted  . PROM (premature rupture of membranes) 01/06/2018  . Cholecystitis, acute with cholelithiasis 03/16/2015     Objective: BP (!) 101/53   Pulse 76   Temp 97.6 F (36.4 C) (Oral)   Resp 16   Ht 5\' 4"  (1.626 m)   Wt 96.6 kg (213 lb)   LMP 04/03/2017 (Approximate)   BMI 36.56 kg/m  No intake/output data recorded. No intake/output data recorded. NST: FHR baseline 130 bpm, Variability: moderate, Accelerations:present, Decelerations:  Absent= Cat 1/Reactive CTX:  regular, every 2-4 minutes, moderate to palpate, lasting for 60sec SVE:  Dilation: 3 Effacement (%): 80 Station: -2 Exam by:: J. Olusegun Gerstenberger CNM  Last Cervical check was at 1201 am on 01/07/2018  Pitocin at (8) mUn/min   Assessment:  Lori Donaldson, 31 y.o., G1P0, with an IUP @ 3224w6d, presented for PROM at 0745 on 06/30 with possible light meconium. Patient Active Problem List   Diagnosis Date Noted  . PROM (premature rupture of membranes) 01/06/2018  . Cholecystitis, acute with cholelithiasis 03/16/2015  IOL d/t PROM  NICHD: Category 1. Pt in stable condition.   Membranes:  SROM x 17 hrs, no s/s of infection  Induction:    Pitocin at (8) mUn/min  Pain management:                Epidural placement: at (2130) on 06/30  GBS Negative  Plan: Continue labor plan Continuous/intermittent monitoring Rest/Frequent position changes to facilitate fetal rotation and descent..  Induction: Continue to increase pitocin by 2 unless tachsystole occurs, then decrease  Will reassess with pt progress at 0600 am or earlier if necessary Plan to start Unasyn if pt spikes fever.  Anticipate labor  progression and vaginal delivery.    Dale DurhamJade Judine Arciniega, NP-C, CNM, MSN 01/07/2018. 12:30 AM

## 2018-01-07 NOTE — Lactation Note (Addendum)
This note was copied from a baby's chart. Lactation Consultation Note  Patient Name: Lori Donaldson ZOXWR'UToday's Date: 01/07/2018 Reason for consult: Initial assessment;Term;Infant < 6lbs;1st time breastfeeding  11 hour female infant Mom mention breast changes and leaking breast milk in pregnancy. LC entered room mom holding baby STS, baby was sleeping.   Will inform LC to help with next feeding. Mom reported she has been using cross-cradle and foot ball hold. Made attempts with feeding notice baby is sleepy and last attempt she felt baby did not latch well. LC notice 7 hour gap between feedings, despite attempts.  If big gap in feeds call LC to  Assist with  hand express and spoon feed baby.  LC discussed hunger cues, feeding 8 to 12 times within 24 hours including nights, not go past 3 hours for feeding baby. Mom demonstrated hand expression to [facility]: hotline, LC outpatient, BF support and community resources.        Lori Donaldson 01/07/2018, 5:05 PM

## 2018-01-07 NOTE — Anesthesia Postprocedure Evaluation (Signed)
Anesthesia Post Note  Patient: [name]  Procedure(s) Performed: AN AD HOC LABOR EPIDURAL     Patient location during evaluation: Mother Baby Anesthesia Type: Epidural Level of consciousness: awake and alert Pain management: pain level controlled Vital Signs Assessment: post-procedure vital signs reviewed and stable Respiratory status: spontaneous breathing Cardiovascular status: blood pressure returned to baseline Postop Assessment: no headache, no backache, epidural receding, patient able to bend at knees, able to ambulate, adequate PO intake and no apparent nausea or vomiting Anesthetic complications: no    Last Vitals:  Vitals:   01/07/18 0741 01/07/18 0758  BP:    Pulse:    Resp: 18 18  Temp: 36.9 C     Last Pain:  Vitals:   01/07/18 0741  TempSrc: Oral  PainSc:    Pain Goal: Patients Stated Pain Goal: 7 (01/06/18 1340)               [name]

## 2018-01-08 LAB — CBC
HCT: 29.7 % — ABNORMAL LOW (ref 36.0–46.0)
Hemoglobin: 10.2 g/dL — ABNORMAL LOW (ref 12.0–15.0)
MCH: 30.4 pg (ref 26.0–34.0)
MCHC: 34.3 g/dL (ref 30.0–36.0)
MCV: 88.7 fL (ref 78.0–100.0)
PLATELETS: 254 10*3/uL (ref 150–400)
RBC: 3.35 MIL/uL — ABNORMAL LOW (ref 3.87–5.11)
RDW: 13.6 % (ref 11.5–15.5)
WBC: 10.9 10*3/uL — ABNORMAL HIGH (ref 4.0–10.5)

## 2018-01-08 LAB — BIRTH TISSUE RECOVERY COLLECTION (PLACENTA DONATION)

## 2018-01-08 NOTE — Progress Notes (Signed)

## 2018-01-08 NOTE — Discharge Instructions (Signed)
Vaginal Delivery, Care After °Refer to this sheet in the next few weeks. These instructions provide you with information about caring for yourself after vaginal delivery. Your health care provider may also give you more specific instructions. Your treatment has been planned according to current medical practices, but problems sometimes occur. Call your health care provider if you have any problems or questions. °What can I expect after the procedure? °After vaginal delivery, it is common to have: °· Some bleeding from your vagina. °· Soreness in your abdomen, your vagina, and the area of skin between your vaginal opening and your anus (perineum). °· Pelvic cramps. °· Fatigue. ° °Follow these instructions at home: °Medicines °· Take over-the-counter and prescription medicines only as told by your health care provider. °· If you were prescribed an antibiotic medicine, take it as told by your health care provider. Do not stop taking the antibiotic until it is finished. °Driving ° °· Do not drive or operate heavy machinery while taking prescription pain medicine. °· Do not drive for 24 hours if you received a sedative. °Lifestyle °· Do not drink alcohol. This is especially important if you are breastfeeding or taking medicine to relieve pain. °· Do not use tobacco products, including cigarettes, chewing tobacco, or e-cigarettes. If you need help quitting, ask your health care provider. °Eating and drinking °· Drink at least 8 eight-ounce glasses of water every day unless you are told not to by your health care provider. If you choose to breastfeed your baby, you may need to drink more water than this. °· Eat high-fiber foods every day. These foods may help prevent or relieve constipation. High-fiber foods include: °? Whole grain cereals and breads. °? Brown rice. °? Beans. °? Fresh fruits and vegetables. °Activity °· Return to your normal activities as told by your health care provider. Ask your health care provider  what activities are safe for you. °· Rest as much as possible. Try to rest or take a nap when your baby is sleeping. °· Do not lift anything that is heavier than your baby or 10 lb (4.5 kg) until your health care provider says that it is safe. °· Talk with your health care provider about when you can engage in sexual activity. This may depend on your: °? Risk of infection. °? Rate of healing. °? Comfort and desire to engage in sexual activity. °Vaginal Care °· If you have an episiotomy or a vaginal tear, check the area every day for signs of infection. Check for: °? More redness, swelling, or pain. °? More fluid or blood. °? Warmth. °? Pus or a bad smell. °· Do not use tampons or douches until your health care provider says this is safe. °· Watch for any blood clots that may pass from your vagina. These may look like clumps of dark red, brown, or black discharge. °General instructions °· Keep your perineum clean and dry as told by your health care provider. °· Wear loose, comfortable clothing. °· Wipe from front to back when you use the toilet. °· Ask your health care provider if you can shower or take a bath. If you had an episiotomy or a perineal tear during labor and delivery, your health care provider may tell you not to take baths for a certain length of time. °· Wear a bra that supports your breasts and fits you well. °· If possible, have someone help you with household activities and help care for your baby for at least a few days after   you leave the hospital. °· Keep all follow-up visits for you and your baby as told by your health care provider. This is important. °Contact a health care provider if: °· You have: °? Vaginal discharge that has a bad smell. °? Difficulty urinating. °? Pain when urinating. °? A sudden increase or decrease in the frequency of your bowel movements. °? More redness, swelling, or pain around your episiotomy or vaginal tear. °? More fluid or blood coming from your episiotomy or  vaginal tear. °? Pus or a bad smell coming from your episiotomy or vaginal tear. °? A fever. °? A rash. °? Little or no interest in activities you used to enjoy. °? Questions about caring for yourself or your baby. °· Your episiotomy or vaginal tear feels warm to the touch. °· Your episiotomy or vaginal tear is separating or does not appear to be healing. °· Your breasts are painful, hard, or turn red. °· You feel unusually sad or worried. °· You feel nauseous or you vomit. °· You pass large blood clots from your vagina. If you pass a blood clot from your vagina, save it to show to your health care provider. Do not flush blood clots down the toilet without having your health care provider look at them. °· You urinate more than usual. °· You are dizzy or light-headed. °· You have not breastfed at all and you have not had a menstrual period for 12 weeks after delivery. °· You have stopped breastfeeding and you have not had a menstrual period for 12 weeks after you stopped breastfeeding. °Get help right away if: °· You have: °? Pain that does not go away or does not get better with medicine. °? Chest pain. °? Difficulty breathing. °? Blurred vision or spots in your vision. °? Thoughts about hurting yourself or your baby. °· You develop pain in your abdomen or in one of your legs. °· You develop a severe headache. °· You faint. °· You bleed from your vagina so much that you fill two sanitary pads in one hour. °This information is not intended to replace advice given to you by your health care provider. Make sure you discuss any questions you have with your health care provider. °Document Released: 06/23/2000 Document Revised: 12/08/2015 Document Reviewed: 07/11/2015 °Elsevier Interactive Patient Education © 2018 [employer] ° °

## 2018-01-08 NOTE — Progress Notes (Signed)
Postpartum Note Day # 1  S:  Patient resting comfortable in bed.  Pain controlled.  Tolerating general diet. + flatus, + BM.  Lochia moderate.  Ambulating without difficulty.  She denies n/v/f/c, SOB, or CP.  Pt plans on breastfeeding.  O: Temp:  [98.2 F (36.8 C)-98.7 F (37.1 C)] 98.6 F (37 C) (07/02 0606) Pulse Rate:  [60-86] 73 (07/02 0606) Resp:  [17-20] 18 (07/02 0606) BP: (100-124)/(61-77) 117/77 (07/02 0606) SpO2:  [98 %] 98 % (07/02 0606) Gen: A&Ox3, NAD CV: RRR Resp: CTAB Abdomen: soft, NT, ND +BS Uterus: firm, non-tender, below umbilicus Ext: 1+ non-pitting edema, no calf tenderness bilaterally  Labs:  Recent Labs    01/06/18 1300 01/08/18 0608  HGB 11.5* 10.2*    A/P: Pt is a 32 y.o. G2P1001 s/p NSVD, PPD#1  - Pain well controlled -GU: UOP is adequate -GI: Tolerating general diet -Activity: encouraged sitting up to chair and ambulation as tolerated -Prophylaxis: early ambulation -Labs: stable as above  DISPO: Continue with routine postpartum care, plan for discharge home on PPD#2  Myna HidalgoJennifer Niaomi Cartaya, DO [phone] (cell) [phone] (office)

## 2018-01-08 NOTE — Lactation Note (Signed)
This note was copied from a baby's chart. Lactation Consultation Note  Patient Name: Lori Donaldson Lori Donaldson's Date: 01/08/2018 Reason for consult: Follow-up assessment;Term;1st time breastfeeding;Infant < 6lbs   Follow up with mom of 34 hour old infant. Infant with 5 BF for 10-20 minutes, 9 BF attempts, EBM x 2 of 2-3 cc, 5 voids and 2 stools in the last 24 hours. Infant weight 5 pounds 11.2 ounces with weight loss of 4% since birth. LATCH scores 8.   Mom reports infant is feeding for short amounts of time. Infant is sleepy at the breast needing stimulation. Mom has started to pump today and is getting small amounts of colostrum. Enc mom to continue pumping about every 2-3 hours post BF and follow with hand expression, all EBM should be fed to infant.   Discussed with mom that an infant at 24-48 hour of age tends to be sleepier at the breast, reviewed importance of stimulation and breast massage to maintain active suckling at the breast. Reviewed with mom that the infant weight is also less that 6 pounds it is very important to make sure infant stays awake to transfer from the breast. Reviewed with mom the importance of pumping and hand expressing to promote milk production and also to use to supplement infant with.   Discussed with mom that infant currently with adequate voids and stools. Reviewed that infant may need to have formula started if she is not willing to latch to the breast and mom is not able to pump sufficient quantities of milk for the infant. Mom reports she prefers not to give formula at this time. Mom did hand express with some help and was able to spoon feed infant independently. Reviewed importance of feeding infant all EBM that is expressed.   Worked with mom ion positioning, awakening techniques, latching, breast massage, hand expression, feeding cues and when to know infant is satisfied from feeding.   Infant fed on both breasts. She did need stimulation and frequent burping.  She was more active on the second breast than the first breast. Infant with good swallows at the breast. Enc mom to hand express and spoon feed infant prior to latch as needed to stimulate her to want to feed.   Mom voiced understanding to all teaching. Report and plan of care to Lori RiasKim Isley, RN.    Maternal Data Formula Feeding for Exclusion: No Has patient been taught Hand Expression?: Yes Does the patient have breastfeeding experience prior to this delivery?: No  Feeding Feeding Type: Breast Fed Length of feed: 20 min  LATCH Score Latch: Repeated attempts needed to sustain latch, nipple held in mouth throughout feeding, stimulation needed to elicit sucking reflex.  Audible Swallowing: Spontaneous and intermittent  Type of Nipple: Everted at rest and after stimulation  Comfort (Breast/Nipple): Soft / non-tender  Hold (Positioning): Assistance needed to correctly position infant at breast and maintain latch.  LATCH Score: 8  Interventions Interventions: Breast feeding basics reviewed;Support pillows;Assisted with latch;Position options;Skin to skin;Breast massage;Breast compression;DEBP;Hand express;Expressed milk  Lactation Tools Discussed/Used Pump Review: Setup, frequency, and cleaning;Milk Storage Initiated by:: Reviewed and encouraged every 2-3 hours post BF   Consult Status Consult Status: Follow-up Date: 01/09/18 Follow-up type: In-patient    Lori Donaldson 01/08/2018, 4:31 PM

## 2018-01-09 ENCOUNTER — Ambulatory Visit: Payer: Self-pay

## 2018-01-09 MED ORDER — IBUPROFEN 600 MG PO TABS: 600.0000 mg | ORAL_TABLET | Freq: Four times a day (QID) | ORAL | 0 refills | Status: DC

## 2018-01-09 NOTE — Discharge Summary (Signed)
OB Discharge Summary     Patient Name: Lori Donaldson DOB: 05/24/1986 MRN: [id number]  Date of admission: 01/06/2018 Delivering MD: Dale Northfield   Date of discharge: 01/09/2018  Admitting diagnosis: 31 WKS, WATER BROKE Intrauterine pregnancy: [redacted]w[redacted]d     Secondary diagnosis:  Active Problems:   PROM (premature rupture of membranes)   Normal postpartum course  Additional problems: none     Discharge diagnosis: Term Pregnancy Delivered                                                                                                Post partum procedures:n/a  Augmentation: Pitocin  Complications: None  Hospital course:  Onset of Labor With Vaginal Delivery     32 y.o. yo G1P1001 at [redacted]w[redacted]d was admitted in Latent Labor on 01/06/2018. Patient had an uncomplicated labor course as follows:  Membrane Rupture Time/Date: 9:00 AM ,01/06/2018   Intrapartum Procedures: Episiotomy: None [1]                                         Lacerations:  2nd degree [3]  Patient had a delivery of a Viable infant. 01/07/2018  Information for the patient's newborn:  [place], [place] [[id number]]  Delivery Method: Vaginal, Spontaneous(Filed from Delivery Summary)    Pateint had an uncomplicated postpartum course.  She is ambulating, tolerating a regular diet, passing flatus, and urinating well. Patient is discharged home in stable condition on 01/09/18.   Physical exam  Vitals:   01/08/18 0606 01/08/18 1355 01/08/18 2200 01/09/18 0532  BP: 117/77 118/79 120/81 124/83  Pulse: 73 84 77 70  Resp: 18 16 20 18   Temp: 98.6 F (37 C) 98.3 F (36.8 C) 98.3 F (36.8 C) 98.5 F (36.9 C)  TempSrc: Oral Oral Oral Oral  SpO2: 98% 97%  99%  Weight:      Height:       General: alert, cooperative and no distress  CV: RRR Lungs: CTAB Lochia: appropriate Uterine Fundus: firm Incision: N/A DVT Evaluation: No evidence of DVT seen on physical exam.  Labs: Lab Results  Component Value Date   WBC 10.9  (H) 01/08/2018   HGB 10.2 (L) 01/08/2018   HCT 29.7 (L) 01/08/2018   MCV 88.7 01/08/2018   PLT 254 01/08/2018   CMP Latest Ref Rng & Units 04/24/2017  Glucose 65 - 99 mg/dL 91  BUN 6 - 20 mg/dL 11  Creatinine 7.82 - 9.56 mg/dL 2.13  Sodium 086 - 578 mmol/L 138  Potassium 3.5 - 5.1 mmol/L 3.9  Chloride 101 - 111 mmol/L 108  CO2 22 - 32 mmol/L 24  Calcium 8.9 - 10.3 mg/dL 9.0  Total Protein 6.5 - 8.1 g/dL 7.2  Total Bilirubin 0.3 - 1.2 mg/dL 0.5  Alkaline Phos 38 - 126 U/L 88  AST 15 - 41 U/L 16  ALT 14 - 54 U/L 14    Discharge instruction: per After Visit Summary and "Baby and Me Booklet".  After visit meds:  Allergies as of 01/09/2018   No Known Allergies     Medication List    TAKE these medications   calcium carbonate 500 MG chewable tablet Commonly known as:  TUMS - dosed in mg elemental calcium Chew 1-2 tablets by mouth 4 (four) times daily as needed for indigestion or heartburn.   ibuprofen 600 MG tablet Commonly known as:  ADVIL,MOTRIN Take 1 tablet (600 mg total) by mouth every 6 (six) hours.   prenatal multivitamin Tabs tablet Take 1 tablet by mouth daily at 12 noon.       Diet: routine diet  Activity: Advance as tolerated. Pelvic rest for 6 weeks.   Outpatient follow up:2 weeks Follow up Appt:No future appointments. Follow up Visit:No follow-ups on file.  Postpartum contraception: Undecided  Newborn Data: Live born female  Birth Weight: 5 lb 15.4 oz (2705 g) APGAR: 8, 9  Newborn Delivery   Birth date/time:  01/07/2018 05:28:00 Delivery type:  Vaginal, Spontaneous     Baby Feeding: Breast Disposition:home with mother   01/09/2018 Sharon SellerJennifer M [place] Rico, DO

## 2018-01-09 NOTE — Lactation Note (Signed)
This note was copied from a baby's chart. Lactation Consultation Note: Mother reports that she fed infant for 15 mins. Latch scores 9-10 with last feedings. Mother denies discomfort with latch. Mother reports that infant has good strong tug and she is aware that infant is swallowing.   Infant is at 9% weight loss.  Mother is now concerned that she is not going to make enough milk because it was advised that she supplement.  MD order to supplement. Mother has a DEBP sat up in her room. Advised her to pump once more before going home.  Father is now on the phone with insurance company to see if they provide an electric pump.   Staff nurse gave mother formula. Mother reports that infant took 15 ml .   I offered infant 5 ml with a gloved finger and a curved tip syringe. Advised mother to use method of choice to supplement . Mother was given supplemental guidelines.  Lots of support and encouragement given. Reviewed cluster feeding and cue base feeding . Mother has a harmony hand pump. If insurance doesn't provide a pump, mother plans to purchase a pump.   Advised mother to continue to do frequent skin to skin and feed infant 8-12 times in 24hours as well as cue base feeding. Discussed cluster feeding.  Advised mother to breastfeed infant , supplement and post pump.   [facility]   Patient Name: Lori Jolly MangoJessica Donaldson ZOXWR'UToday's Date: 01/09/2018 Reason for consult: Follow-up assessment   Maternal Data    Feeding Feeding Type: Formula Nipple Type: Slow - flow Length of feed: 5 min  LATCH Score Latch: Repeated attempts needed to sustain latch, nipple held in mouth throughout feeding, stimulation needed to elicit sucking reflex.  Audible Swallowing: Spontaneous and intermittent  Type of Nipple: Everted at rest and after stimulation  Comfort (Breast/Nipple): Soft / non-tender  Hold (Positioning): No assistance needed to correctly position  infant at breast.  LATCH Score: 9  Interventions Interventions: Assisted with latch;Skin to skin;Breast massage;Hand express;Breast compression;Adjust position;Support pillows;Position options;Expressed milk;Hand pump;DEBP;Ice  Lactation Tools Discussed/Used Tools: Pump Breast pump type: Manual   Consult Status Consult Status: Complete    Lori Donaldson, Lori Donaldson 01/09/2018, 11:06 AM

## 2018-01-11 DIAGNOSIS — L918 Other hypertrophic disorders of the skin: Secondary | ICD-10-CM | POA: Diagnosis not present

## 2018-01-11 DIAGNOSIS — Z00110 Health examination for newborn under 8 days old: Secondary | ICD-10-CM | POA: Diagnosis not present

## 2018-01-16 ENCOUNTER — Inpatient Hospital Stay (HOSPITAL_COMMUNITY): Payer: BLUE CROSS/BLUE SHIELD

## 2018-03-01 DIAGNOSIS — Z124 Encounter for screening for malignant neoplasm of cervix: Secondary | ICD-10-CM | POA: Diagnosis not present

## 2018-04-03 ENCOUNTER — Other Ambulatory Visit: Payer: Self-pay | Admitting: Obstetrics & Gynecology

## 2018-04-03 DIAGNOSIS — N939 Abnormal uterine and vaginal bleeding, unspecified: Secondary | ICD-10-CM | POA: Diagnosis not present

## 2018-04-03 DIAGNOSIS — N72 Inflammatory disease of cervix uteri: Secondary | ICD-10-CM | POA: Diagnosis not present

## 2018-04-03 DIAGNOSIS — Z3202 Encounter for pregnancy test, result negative: Secondary | ICD-10-CM | POA: Diagnosis not present

## 2018-04-03 DIAGNOSIS — N871 Moderate cervical dysplasia: Secondary | ICD-10-CM | POA: Diagnosis not present

## 2018-06-13 ENCOUNTER — Other Ambulatory Visit: Payer: Self-pay | Admitting: Obstetrics & Gynecology

## 2018-06-13 DIAGNOSIS — N871 Moderate cervical dysplasia: Secondary | ICD-10-CM | POA: Diagnosis not present

## 2018-06-13 DIAGNOSIS — D069 Carcinoma in situ of cervix, unspecified: Secondary | ICD-10-CM | POA: Diagnosis not present

## 2018-06-13 DIAGNOSIS — Z3202 Encounter for pregnancy test, result negative: Secondary | ICD-10-CM | POA: Diagnosis not present

## 2018-07-04 DIAGNOSIS — D069 Carcinoma in situ of cervix, unspecified: Secondary | ICD-10-CM | POA: Diagnosis not present

## 2018-07-04 DIAGNOSIS — Z9889 Other specified postprocedural states: Secondary | ICD-10-CM | POA: Diagnosis not present

## 2018-10-26 DIAGNOSIS — F411 Generalized anxiety disorder: Secondary | ICD-10-CM | POA: Diagnosis not present

## 2018-12-27 IMAGING — US US TRANSVAGINAL NON-OB
1 series · 13 of 25 positions shown · non-contrast
Comparison: None.

CLINICAL DATA: Right-sided adnexal tenderness

EXAM:
TRANSABDOMINAL AND TRANSVAGINAL ULTRASOUND OF PELVIS
DOPPLER ULTRASOUND OF OVARIES
TECHNIQUE: Both transabdominal and transvaginal ultrasound examinations of the
pelvis were performed. Transabdominal technique was performed for
global imaging of the pelvis including uterus, ovaries, adnexal
regions, and pelvic cul-de-sac.
It was necessary to proceed with endovaginal exam following the
transabdominal exam to visualize the uterus, ovaries, and adnexa.
Color and duplex Doppler ultrasound was utilized to evaluate blood
flow to the ovaries.

[Series 1: us transvaginal non-ob · 0.23mm/px · 13 of 104 slices shown]
[im 1/104]
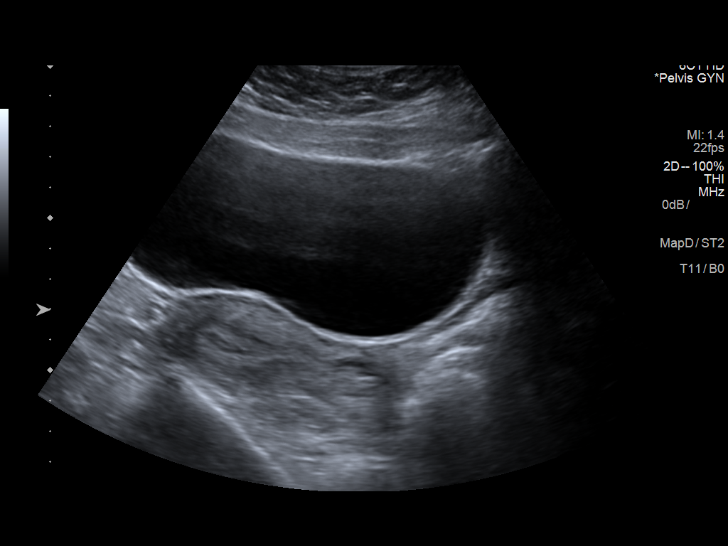
[im 9/104]
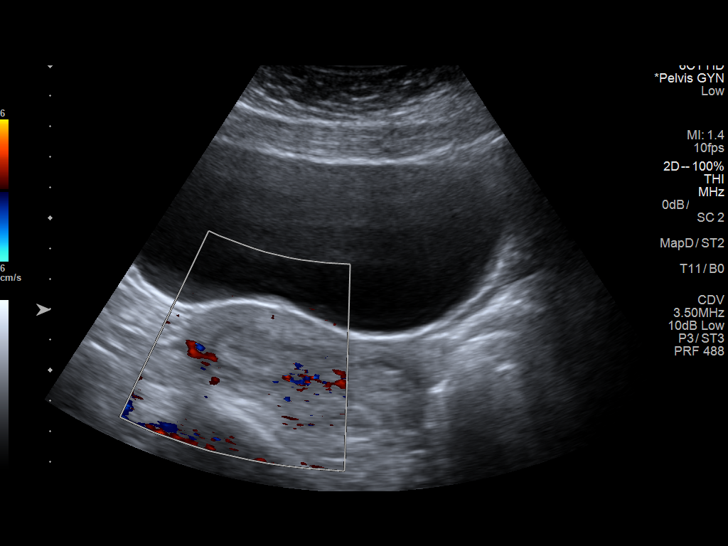
[im 18/104]
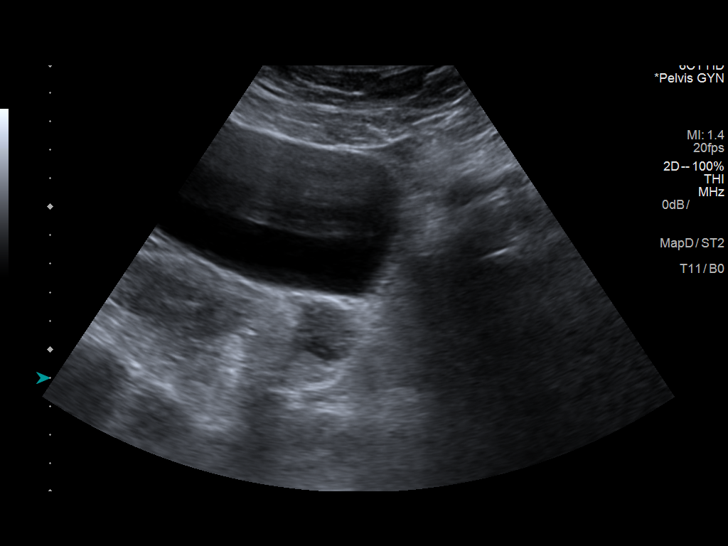
[im 26/104]
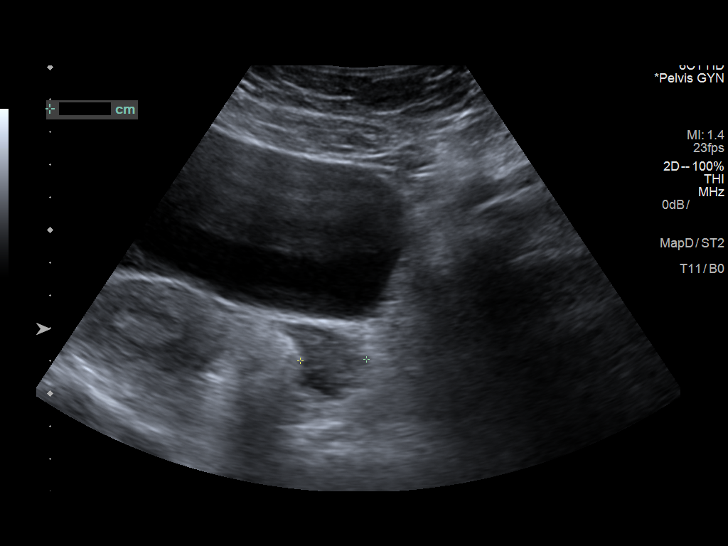
[im 35/104]
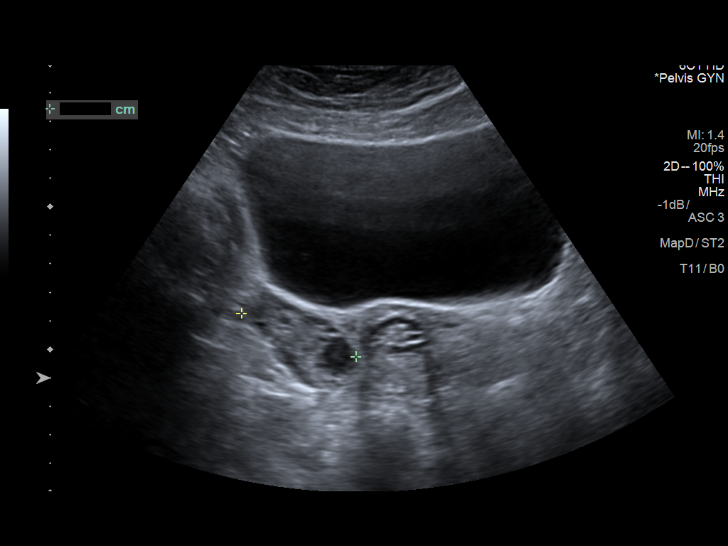
[im 43/104]
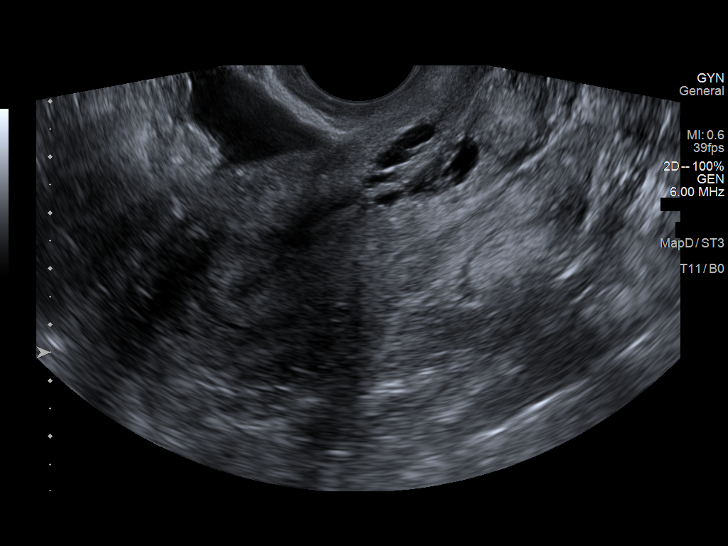
[im 52/104]
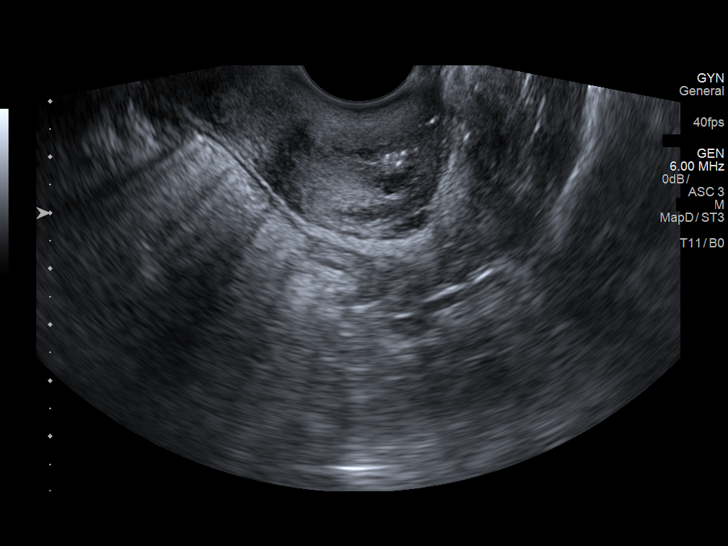
[im 61/104]
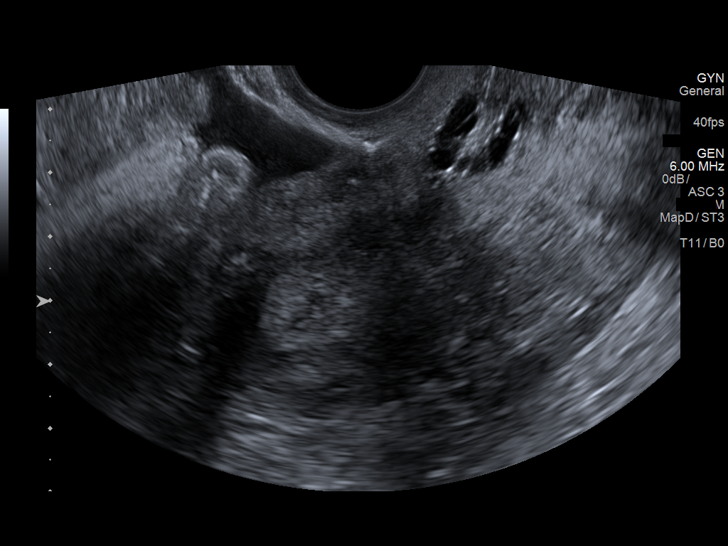
[im 69/104]
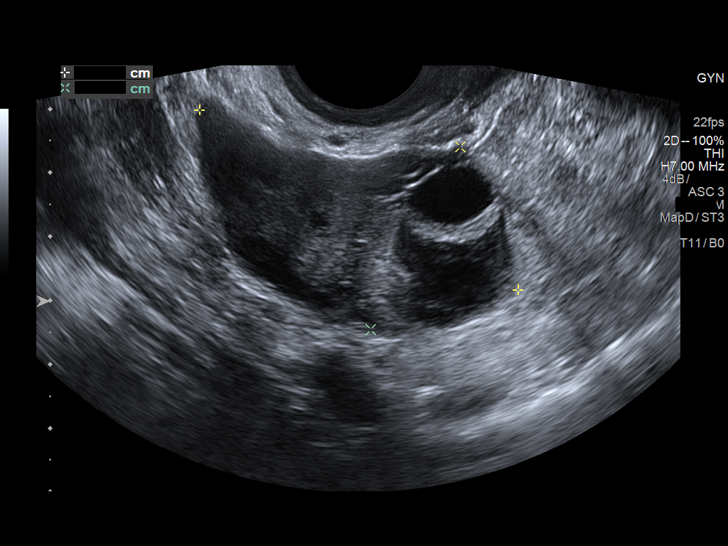
[im 78/104]
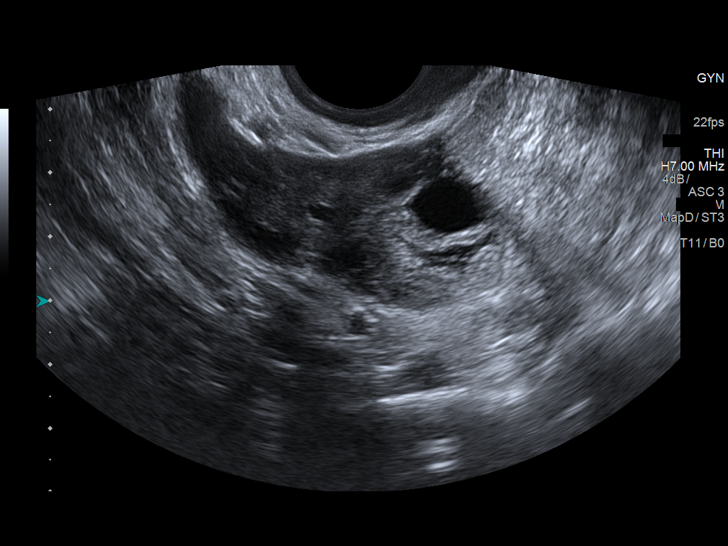
[im 86/104]
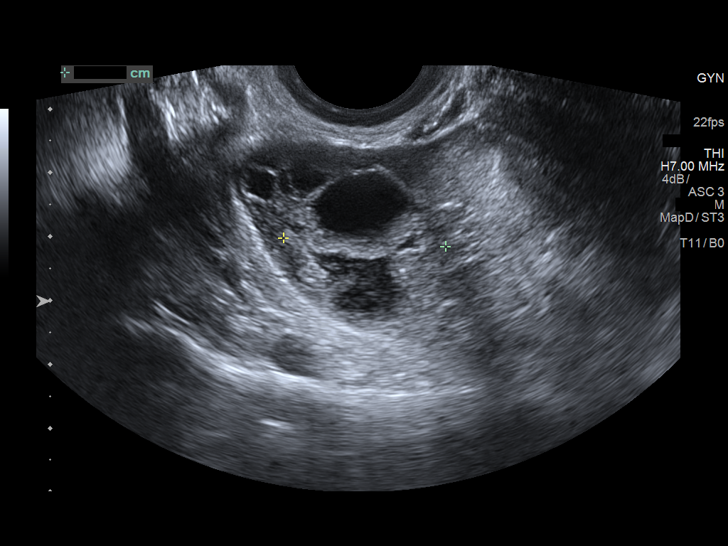
[im 95/104]
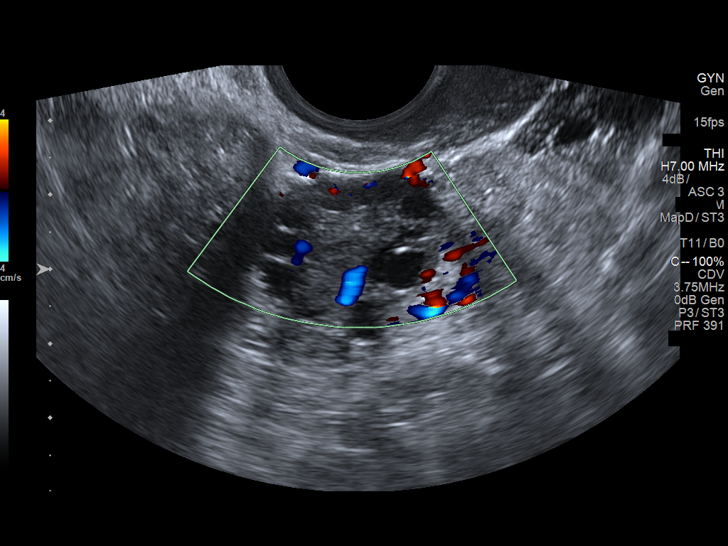
[im 104/104]
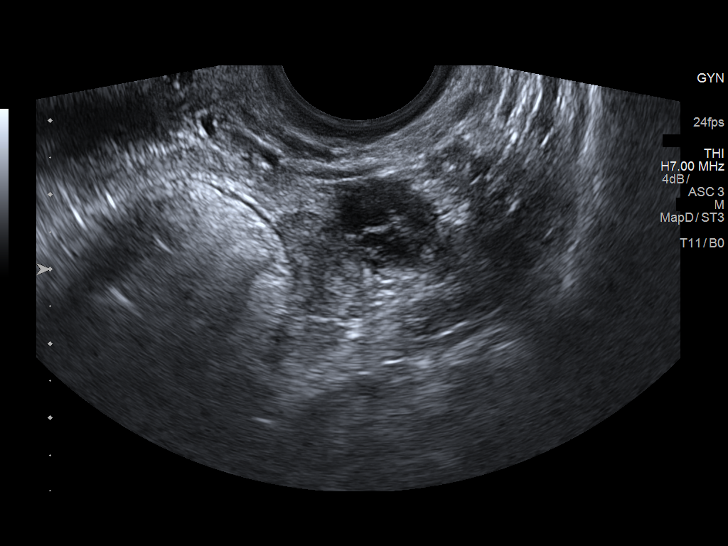

[13 of 25 positions shown; findings below may reference images not displayed]

FINDINGS: Uterus

Measurements: 8.9 x 3.7 x 5.2 cm. Complex cystic lesion within the
uterine cervix is likely a nabothian cyst. Example at 2.1 cm..

Endometrium

Thickness: Normal for age, 15 mm..  No focal abnormality visualized.

Right ovary

Measurements: 5.7 x 3.2 x 3.5 cm. Complex cystic lesion within the
inferior portion the right ovary demonstrates peripheral
hypervascularity. Measures 2.8 x 2.3 x 2.5 cm.

Left ovary

Measurements: 3.7 x 2.5 x 3.3 cm. Normal appearance/no adnexal mass.

Pulsed Doppler evaluation of both ovaries demonstrates normal
low-resistance arterial and venous waveforms. Normal color Doppler
signal to both ovaries.

Other findings

No significant free fluid.
IMPRESSION: 1. Right ovarian lesion is favored to represent a corpus luteal
cyst, likely complicated by hemorrhage. As it is technically
indeterminate, consider ultrasound follow-up 6-12 weeks.
2. No ovarian or adnexal torsion.
3. Presumed nabothian cyst.

## 2019-06-26 DIAGNOSIS — Z01419 Encounter for gynecological examination (general) (routine) without abnormal findings: Secondary | ICD-10-CM | POA: Diagnosis not present

## 2019-09-11 DIAGNOSIS — F411 Generalized anxiety disorder: Secondary | ICD-10-CM | POA: Diagnosis not present

## 2019-11-17 DIAGNOSIS — F411 Generalized anxiety disorder: Secondary | ICD-10-CM | POA: Diagnosis not present

## 2020-06-15 DIAGNOSIS — F411 Generalized anxiety disorder: Secondary | ICD-10-CM | POA: Diagnosis not present

## 2020-07-08 DIAGNOSIS — B349 Viral infection, unspecified: Secondary | ICD-10-CM | POA: Diagnosis not present

## 2020-07-08 DIAGNOSIS — Z20822 Contact with and (suspected) exposure to covid-19: Secondary | ICD-10-CM | POA: Diagnosis not present

## 2020-08-01 DIAGNOSIS — Z20822 Contact with and (suspected) exposure to covid-19: Secondary | ICD-10-CM | POA: Diagnosis not present

## 2020-08-27 DIAGNOSIS — M79672 Pain in left foot: Secondary | ICD-10-CM | POA: Diagnosis not present

## 2020-08-27 DIAGNOSIS — M791 Myalgia, unspecified site: Secondary | ICD-10-CM | POA: Diagnosis not present

## 2020-08-27 DIAGNOSIS — R5383 Other fatigue: Secondary | ICD-10-CM | POA: Diagnosis not present

## 2020-08-27 DIAGNOSIS — M79671 Pain in right foot: Secondary | ICD-10-CM | POA: Diagnosis not present

## 2020-08-30 DIAGNOSIS — R5383 Other fatigue: Secondary | ICD-10-CM | POA: Diagnosis not present

## 2020-08-30 DIAGNOSIS — M791 Myalgia, unspecified site: Secondary | ICD-10-CM | POA: Diagnosis not present

## 2020-12-29 DIAGNOSIS — M79672 Pain in left foot: Secondary | ICD-10-CM | POA: Diagnosis not present

## 2020-12-29 DIAGNOSIS — M722 Plantar fascial fibromatosis: Secondary | ICD-10-CM | POA: Diagnosis not present

## 2021-01-24 DIAGNOSIS — M722 Plantar fascial fibromatosis: Secondary | ICD-10-CM | POA: Diagnosis not present

## 2021-03-01 ENCOUNTER — Encounter: Payer: Self-pay | Admitting: Adult Health

## 2021-03-01 ENCOUNTER — Other Ambulatory Visit: Payer: Self-pay

## 2021-03-01 ENCOUNTER — Ambulatory Visit (INDEPENDENT_AMBULATORY_CARE_PROVIDER_SITE_OTHER): Payer: Self-pay | Admitting: Adult Health

## 2021-03-01 VITALS — BP 111/76 | HR 68 | Ht 64.0 in | Wt 200.0 lb

## 2021-03-01 DIAGNOSIS — N926 Irregular menstruation, unspecified: Secondary | ICD-10-CM | POA: Insufficient documentation

## 2021-03-01 DIAGNOSIS — Z3A09 9 weeks gestation of pregnancy: Secondary | ICD-10-CM | POA: Insufficient documentation

## 2021-03-01 DIAGNOSIS — O3680X0 Pregnancy with inconclusive fetal viability, not applicable or unspecified: Secondary | ICD-10-CM | POA: Insufficient documentation

## 2021-03-01 LAB — POCT URINE PREGNANCY: Preg Test, Ur: POSITIVE — AB

## 2021-03-01 NOTE — Progress Notes (Signed)
  Subjective:     Patient ID: Lori Donaldson, female   DOB: [birth year], 35 y.o.   MRN: [id number]  HPI Lori Donaldson is a 35 year old Hispanic female, married, in for UPT, has missed periods.She had seen Dr Charlotta Newton last pregnancy. PCP is Dr Riley Nearing.   Review of Systems +missed periods  Had postpartum anxiety, has therapist  Reviewed past medical,surgical, social and family history. Reviewed medications and allergies.     Objective:   Physical Exam BP 111/76 (BP Location: Right Arm, Patient Position: Sitting, Cuff Size: Normal)   Pulse 68   Ht 5\' 4"  (1.626 m)   Wt 200 lb (90.7 kg)   LMP 12/27/2020 (Exact Date)   Breastfeeding No   BMI 34.33 kg/m  UPT is +, about 9+1 week by LMP with EDD 10/03/21.Skin warm and dry. Neck: mid line trachea, normal thyroid, good ROM, no lymphadenopathy noted. Lungs: clear to ausculation bilaterally. Cardiovascular: regular rate and rhythm. Abdomen is soft and non tender.   AA is 0 Fall risk is low Depression screen [facility] 2/9 03/01/2021  Decreased Interest 0  Down, Depressed, Hopeless 0  PHQ - 2 Score 0  Altered sleeping 0  Tired, decreased energy 1  Change in appetite 0  Feeling bad or failure about yourself  0  Trouble concentrating 0  Moving slowly or fidgety/restless 0  Suicidal thoughts 0  PHQ-9 Score 1    GAD 7 : Generalized Anxiety Score 03/01/2021  Nervous, Anxious, on Edge 0  Control/stop worrying 0  Worry too much - different things 0  Trouble relaxing 0  Restless 0  Easily annoyed or irritable 1  Afraid - awful might happen 0  Total GAD 7 Score 1      Upstream - 03/01/21 1042       Pregnancy Intention Screening   Does the patient want to become pregnant in the next year? N/A    Does the patient's partner want to become pregnant in the next year? N/A    Would the patient like to discuss contraceptive options today? No      Contraception Wrap Up   Current Method Pregnant/Seeking Pregnancy    End Method Pregnant/Seeking Pregnancy     Contraception Counseling Provided No                Assessment:     1. Missed period UPT + Continue OTC PNV  2. [redacted] weeks gestation of pregnancy   3. Encounter to determine fetal viability of pregnancy, single or unspecified fetus Return in 2 weeks for dating 03/03/21    Plan:     Review handout by [facility]

## 2021-03-15 ENCOUNTER — Other Ambulatory Visit: Payer: Self-pay

## 2021-03-15 ENCOUNTER — Ambulatory Visit (INDEPENDENT_AMBULATORY_CARE_PROVIDER_SITE_OTHER): Payer: BC Managed Care – PPO

## 2021-03-15 DIAGNOSIS — Z3A11 11 weeks gestation of pregnancy: Secondary | ICD-10-CM | POA: Diagnosis not present

## 2021-03-15 DIAGNOSIS — O3680X0 Pregnancy with inconclusive fetal viability, not applicable or unspecified: Secondary | ICD-10-CM | POA: Diagnosis not present

## 2021-03-15 NOTE — Progress Notes (Signed)
[place] 10+1 wks,single IUP with yolk sac,fhr 169 bpm,CRL 33.80 mm,normal ovaries

## 2021-04-06 ENCOUNTER — Other Ambulatory Visit: Payer: BC Managed Care – PPO

## 2021-04-06 ENCOUNTER — Ambulatory Visit: Payer: BC Managed Care – PPO | Admitting: *Deleted

## 2021-04-06 ENCOUNTER — Encounter: Payer: BC Managed Care – PPO | Admitting: Advanced Practice Midwife

## 2021-04-20 ENCOUNTER — Ambulatory Visit (INDEPENDENT_AMBULATORY_CARE_PROVIDER_SITE_OTHER): Payer: BC Managed Care – PPO | Admitting: Advanced Practice Midwife

## 2021-04-20 ENCOUNTER — Encounter: Payer: Self-pay | Admitting: Advanced Practice Midwife

## 2021-04-20 ENCOUNTER — Ambulatory Visit: Payer: BC Managed Care – PPO | Admitting: *Deleted

## 2021-04-20 ENCOUNTER — Other Ambulatory Visit: Payer: Self-pay

## 2021-04-20 VITALS — BP 106/72 | HR 76 | Wt 202.0 lb

## 2021-04-20 DIAGNOSIS — Z363 Encounter for antenatal screening for malformations: Secondary | ICD-10-CM

## 2021-04-20 DIAGNOSIS — Z3480 Encounter for supervision of other normal pregnancy, unspecified trimester: Secondary | ICD-10-CM

## 2021-04-20 DIAGNOSIS — F419 Anxiety disorder, unspecified: Secondary | ICD-10-CM | POA: Insufficient documentation

## 2021-04-20 DIAGNOSIS — Z3A15 15 weeks gestation of pregnancy: Secondary | ICD-10-CM | POA: Diagnosis not present

## 2021-04-20 DIAGNOSIS — Z3490 Encounter for supervision of normal pregnancy, unspecified, unspecified trimester: Secondary | ICD-10-CM | POA: Insufficient documentation

## 2021-04-20 LAB — POCT URINALYSIS DIPSTICK OB
Blood, UA: NEGATIVE
Glucose, UA: NEGATIVE
Leukocytes, UA: NEGATIVE
Nitrite, UA: NEGATIVE
POC,PROTEIN,UA: NEGATIVE

## 2021-04-20 NOTE — Progress Notes (Signed)
INITIAL OBSTETRICAL VISIT Patient name: Lori Donaldson MRN [id number]  Date of birth: 08-Mar-1986 Chief Complaint:   Initial Prenatal Visit (Headaches, nausea, irritablity/)  History of Present Illness:   Lori Donaldson is a 35 y.o. G48P1001 female at [redacted]w[redacted]d by [place] at 10.1 weeks with an Estimated Date of Delivery: 10/10/21 being seen today for her initial obstetrical visit.   Patient's last menstrual period was 12/27/2020 (exact date). Her obstetrical history is significant for  term SVB 2019, no complications .   Today she reports fatigue.  Last pap she thinks Dec 2020. Results were: negative per pt report at [facility] (signed release for report today)  Depression screen [facility] 2/9 04/20/2021 03/01/2021  Decreased Interest 0 0  Down, Depressed, Hopeless 0 0  PHQ - 2 Score 0 0  Altered sleeping 0 0  Tired, decreased energy 1 1  Change in appetite 0 0  Feeling bad or failure about yourself  1 0  Trouble concentrating 0 0  Moving slowly or fidgety/restless 0 0  Suicidal thoughts 0 0  PHQ-9 Score 2 1     GAD 7 : Generalized Anxiety Score 04/20/2021 03/01/2021  Nervous, Anxious, on Edge 1 0  Control/stop worrying 0 0  Worry too much - different things 0 0  Trouble relaxing 0 0  Restless 0 0  Easily annoyed or irritable 1 1  Afraid - awful might happen 0 0  Total GAD 7 Score 2 1     Review of Systems:   Pertinent items are noted in HPI Denies cramping/contractions, leakage of fluid, vaginal bleeding, abnormal vaginal discharge w/ itching/odor/irritation, headaches, visual changes, shortness of breath, chest pain, abdominal pain, severe nausea/vomiting, or problems with urination or bowel movements unless otherwise stated above.  Pertinent History Reviewed:  Reviewed past medical,surgical, social, obstetrical and family history.  Reviewed problem list, medications and allergies. OB History  Gravida Para Term Preterm AB Living  2 1 1     1   SAB IAB Ectopic Multiple Live  Births        0 1    # Outcome Date GA Lbr Len/2nd Weight Sex Delivery Anes PTL Lv  2 Current           1 Term 01/07/18 [redacted]w[redacted]d 20:00 / 00:28 5 lb 15.4 oz (2.705 kg) F Vag-Spont EPI N LIV   Physical Assessment:  There were no vitals filed for this visit.There is no height or weight on file to calculate BMI.       Physical Examination:  General appearance - well appearing, and in no distress  Mental status - alert, oriented to person, place, and time  Psych:  She has a normal mood and affect  Skin - warm and dry, normal color, no suspicious lesions noted  Chest - effort normal, all lung fields clear to auscultation bilaterally  Heart - normal rate and regular rhythm  Abdomen - soft, nontender; FHTs 146bpm  Extremities:  No swelling or varicosities noted  Pelvic - not indicated  Thin prep pap is not done    TODAY'S NT (not done)  No results found for this or any previous visit (from the past 24 hour(s)).  Assessment & Plan:  1) Low-Risk Pregnancy G2P1001 at [redacted]w[redacted]d with an Estimated Date of Delivery: 10/10/21   2) Initial OB visit  3) Anxiety, no meds, has therapist; PHQ-9 and GAD both neg  Meds: No orders of the defined types were placed in this encounter.   Initial labs  obtained Continue prenatal vitamins Reviewed n/v relief measures and warning s/s to report Reviewed recommended weight gain based on pre-gravid BMI Encouraged well-balanced diet Genetic & carrier screening discussed: undecided about Panorama and Horizon , too late for NT/IT and Horizon  Ultrasound discussed; fetal survey: requested CCNC completed> form faxed if has or is planning to apply for medicaid The nature of [employer] for [employer] with multiple MDs and other Advanced Practice Providers was explained to patient; also emphasized that fellows, residents, and students are part of our team. Does not have home bp cuff. Office bp cuff given: yes. Rx sent: n/a. Check bp weekly, let us know if  consistently >140/90.   No indications for ASA therapy or early HgbA1c (per uptodate)  Follow-up: Return in about 4 weeks (around 05/18/2021) for LROB, [place]: Anatomy, in person.   Orders Placed This Encounter  Procedures   Urine Culture   GC/Chlamydia Probe Amp   US OB Comp + 14 Wk   Pain Management Screening Profile (10S)   CBC/D/Plt+RPR+Rh+ABO+RubIgG...   Hgb Fractionation Cascade   POC Urinalysis Dipstick OB    Arabella Merles [facility] 04/20/2021 2:51 PM

## 2021-04-20 NOTE — Patient Instructions (Signed)
Lori Donaldson, thank you for choosing our office today! We appreciate the opportunity to meet your healthcare needs. You may receive a short survey by mail, e-mail, or through MyChart. If you are happy with your care we would appreciate if you could take just a few minutes to complete the survey questions. We read all of your comments and take your feedback very seriously. Thank you again for choosing our office.  [facility] [place] ([address] [place], [place] [address]) Entrance C, located off of [address] Free 24/7 valet parking   Nausea & Vomiting Have saltine crackers or pretzels by your bed and eat a few bites before you raise your head out of bed in the morning Eat small frequent meals throughout the day instead of large meals Drink plenty of fluids throughout the day to stay hydrated, just don't drink a lot of fluids with your meals.  This can make your stomach fill up faster making you feel sick Do not brush your teeth right after you eat Products with real ginger are good for nausea, like ginger ale and ginger hard candy Make sure it says made with real ginger! Sucking on sour candy like lemon heads is also good for nausea If your prenatal vitamins make you nauseated, take them at night so you will sleep through the nausea Sea Bands If you feel like you need medicine for the nausea & vomiting please let us know If you are unable to keep any fluids or food down please let us know   Constipation Drink plenty of fluid, preferably water, throughout the day Eat foods high in fiber such as fruits, vegetables, and grains Exercise, such as walking, is a good way to keep your bowels regular Drink warm fluids, especially warm prune juice, or decaf coffee Eat a 1/2 cup of real oatmeal (not instant), 1/2 cup applesauce, and 1/2-1 cup warm prune juice every day If needed, you may take Colace (docusate sodium) stool softener  once or twice a day to help keep the stool soft.  If you still are having problems with constipation, you may take Miralax once daily as needed to help keep your bowels regular.   Home Blood Pressure Monitoring for Patients   Your provider has recommended that you check your blood pressure (BP) at least once a week at home. If you do not have a blood pressure cuff at home, one will be provided for you. Contact your provider if you have not received your monitor within 1 week.   Helpful Tips for Accurate Home Blood Pressure Checks  Don't smoke, exercise, or drink caffeine 30 minutes before checking your BP Use the restroom before checking your BP (a full bladder can raise your pressure) Relax in a comfortable upright chair Feet on the ground Left arm resting comfortably on a flat surface at the level of your heart Legs uncrossed Back supported Sit quietly and don't talk Place the cuff on your bare arm Adjust snuggly, so that only two fingertips can fit between your skin and the top of the cuff Check 2 readings separated by at least one minute Keep a log of your BP readings For a visual, please reference this diagram: http://ccnc.care/bpdiagram  Provider Name: [facility]     Phone: [phone]  Zone 1: ALL CLEAR  Continue to monitor your symptoms:  BP reading is less than 140 (top number) or less than 90 (bottom   number)  No right upper stomach pain No headaches or seeing spots No feeling nauseated or throwing up No swelling in face and hands  Zone 2: CAUTION Call your doctor's office for any of the following:  BP reading is greater than 140 (top number) or greater than 90 (bottom number)  Stomach pain under your ribs in the middle or right side Headaches or seeing spots Feeling nauseated or throwing up Swelling in face and hands  Zone 3: EMERGENCY  Seek immediate medical care if you have any of the following:  BP reading is greater than160 (top number) or greater than  110 (bottom number) Severe headaches not improving with Tylenol Serious difficulty catching your breath Any worsening symptoms from Zone 2    First Trimester of Pregnancy The first trimester of pregnancy is from week 1 until the end of week 12 (months 1 through 3). A week after a sperm fertilizes an egg, the egg will implant on the wall of the uterus. This embryo will begin to develop into a baby. Genes from you and your partner are forming the baby. The female genes determine whether the baby is a boy or a girl. At 6-8 weeks, the eyes and face are formed, and the heartbeat can be seen on ultrasound. At the end of 12 weeks, all the baby's organs are formed.  Now that you are pregnant, you will want to do everything you can to have a healthy baby. Two of the most important things are to get good prenatal care and to follow your health care provider's instructions. Prenatal care is all the medical care you receive before the baby's birth. This care will help prevent, find, and treat any problems during the pregnancy and childbirth. BODY CHANGES Your body goes through many changes during pregnancy. The changes vary from woman to woman.  You may gain or lose a couple of pounds at first. You may feel sick to your stomach (nauseous) and throw up (vomit). If the vomiting is uncontrollable, call your health care provider. You may tire easily. You may develop headaches that can be relieved by medicines approved by your health care provider. You may urinate more often. Painful urination may mean you have a bladder infection. You may develop heartburn as a result of your pregnancy. You may develop constipation because certain hormones are causing the muscles that push waste through your intestines to slow down. You may develop hemorrhoids or swollen, bulging veins (varicose veins). Your breasts may begin to grow larger and become tender. Your nipples may stick out more, and the tissue that surrounds them  (areola) may become darker. Your gums may bleed and may be sensitive to brushing and flossing. Dark spots or blotches (chloasma, mask of pregnancy) may develop on your face. This will likely fade after the baby is born. Your menstrual periods will stop. You may have a loss of appetite. You may develop cravings for certain kinds of food. You may have changes in your emotions from day to day, such as being excited to be pregnant or being concerned that something may go wrong with the pregnancy and baby. You may have more vivid and strange dreams. You may have changes in your hair. These can include thickening of your hair, rapid growth, and changes in texture. Some women also have hair loss during or after pregnancy, or hair that feels dry or thin. Your hair will most likely return to normal after your baby is born. WHAT TO EXPECT AT YOUR PRENATAL   VISITS During a routine prenatal visit: You will be weighed to make sure you and the baby are growing normally. Your blood pressure will be taken. Your abdomen will be measured to track your baby's growth. The fetal heartbeat will be listened to starting around week 10 or 12 of your pregnancy. Test results from any previous visits will be discussed. Your health care provider may ask you: How you are feeling. If you are feeling the baby move. If you have had any abnormal symptoms, such as leaking fluid, bleeding, severe headaches, or abdominal cramping. If you have any questions. Other tests that may be performed during your first trimester include: Blood tests to find your blood type and to check for the presence of any previous infections. They will also be used to check for low iron levels (anemia) and Rh antibodies. Later in the pregnancy, blood tests for diabetes will be done along with other tests if problems develop. Urine tests to check for infections, diabetes, or protein in the urine. An ultrasound to confirm the proper growth and development  of the baby. An amniocentesis to check for possible genetic problems. Fetal screens for spina bifida and Down syndrome. You may need other tests to make sure you and the baby are doing well. HOME CARE INSTRUCTIONS  Medicines Follow your health care provider's instructions regarding medicine use. Specific medicines may be either safe or unsafe to take during pregnancy. Take your prenatal vitamins as directed. If you develop constipation, try taking a stool softener if your health care provider approves. Diet Eat regular, well-balanced meals. Choose a variety of foods, such as meat or vegetable-based protein, fish, milk and low-fat dairy products, vegetables, fruits, and whole grain breads and cereals. Your health care provider will help you determine the amount of weight gain that is right for you. Avoid raw meat and uncooked cheese. These carry germs that can cause birth defects in the baby. Eating four or five small meals rather than three large meals a day may help relieve nausea and vomiting. If you start to feel nauseous, eating a few soda crackers can be helpful. Drinking liquids between meals instead of during meals also seems to help nausea and vomiting. If you develop constipation, eat more high-fiber foods, such as fresh vegetables or fruit and whole grains. Drink enough fluids to keep your urine clear or pale yellow. Activity and Exercise Exercise only as directed by your health care provider. Exercising will help you: Control your weight. Stay in shape. Be prepared for labor and delivery. Experiencing pain or cramping in the lower abdomen or low back is a good sign that you should stop exercising. Check with your health care provider before continuing normal exercises. Try to avoid standing for long periods of time. Move your legs often if you must stand in one place for a long time. Avoid heavy lifting. Wear low-heeled shoes, and practice good posture. You may continue to have sex  unless your health care provider directs you otherwise. Relief of Pain or Discomfort Wear a good support bra for breast tenderness.   Take warm sitz baths to soothe any pain or discomfort caused by hemorrhoids. Use hemorrhoid cream if your health care provider approves.   Rest with your legs elevated if you have leg cramps or low back pain. If you develop varicose veins in your legs, wear support hose. Elevate your feet for 15 minutes, 3-4 times a day. Limit salt in your diet. Prenatal Care Schedule your prenatal visits by the   twelfth week of pregnancy. They are usually scheduled monthly at first, then more often in the last 2 months before delivery. Write down your questions. Take them to your prenatal visits. Keep all your prenatal visits as directed by your health care provider. Safety Wear your seat belt at all times when driving. Make a list of emergency phone numbers, including numbers for family, friends, the hospital, and police and fire departments. General Tips Ask your health care provider for a referral to a local prenatal education class. Begin classes no later than at the beginning of month 6 of your pregnancy. Ask for help if you have counseling or nutritional needs during pregnancy. Your health care provider can offer advice or refer you to specialists for help with various needs. Do not use hot tubs, steam rooms, or saunas. Do not douche or use tampons or scented sanitary pads. Do not cross your legs for long periods of time. Avoid cat litter boxes and soil used by cats. These carry germs that can cause birth defects in the baby and possibly loss of the fetus by miscarriage or stillbirth. Avoid all smoking, herbs, alcohol, and medicines not prescribed by your health care provider. Chemicals in these affect the formation and growth of the baby. Schedule a dentist appointment. At home, brush your teeth with a soft toothbrush and be gentle when you floss. SEEK MEDICAL CARE IF:   You have dizziness. You have mild pelvic cramps, pelvic pressure, or nagging pain in the abdominal area. You have persistent nausea, vomiting, or diarrhea. You have a bad smelling vaginal discharge. You have pain with urination. You notice increased swelling in your face, hands, legs, or ankles. SEEK IMMEDIATE MEDICAL CARE IF:  You have a fever. You are leaking fluid from your vagina. You have spotting or bleeding from your vagina. You have severe abdominal cramping or pain. You have rapid weight gain or loss. You vomit blood or material that looks like coffee grounds. You are exposed to German measles and have never had them. You are exposed to fifth disease or chickenpox. You develop a severe headache. You have shortness of breath. You have any kind of trauma, such as from a fall or a car accident. Document Released: 06/20/2001 Document Revised: 11/10/2013 Document Reviewed: 05/06/2013 ExitCare Patient Information 2015 [employer]. This information is not intended to replace advice given to you by your health care provider. Make sure you discuss any questions you have with your health care provider.  

## 2021-04-22 LAB — CBC/D/PLT+RPR+RH+ABO+RUBIGG...
Antibody Screen: NEGATIVE
Basophils Absolute: 0 10*3/uL (ref 0.0–0.2)
Basos: 0 %
EOS (ABSOLUTE): 0.1 10*3/uL (ref 0.0–0.4)
Eos: 2 %
HCV Ab: <0.1 s/co ratio (ref 0.0–0.9)
HIV Screen 4th Generation wRfx: NONREACTIVE
Hematocrit: 41.1 % (ref 34.0–46.6)
Hemoglobin: 13.6 g/dL (ref 11.1–15.9)
Hepatitis B Surface Ag: NEGATIVE
Immature Grans (Abs): 0.1 10*3/uL (ref 0.0–0.1)
Immature Granulocytes: 1 %
Lymphocytes Absolute: 1 10*3/uL (ref 0.7–3.1)
Lymphs: 12 %
MCH: 29.3 pg (ref 26.6–33.0)
MCHC: 33.1 g/dL (ref 31.5–35.7)
MCV: 89 fL (ref 79–97)
Monocytes Absolute: 0.4 10*3/uL (ref 0.1–0.9)
Monocytes: 5 %
Neutrophils Absolute: 6.6 10*3/uL (ref 1.4–7.0)
Neutrophils: 80 %
Platelets: 310 10*3/uL (ref 150–450)
RBC: 4.64 x10E6/uL (ref 3.77–5.28)
RDW: 12.5 % (ref 11.7–15.4)
RPR Ser Ql: NONREACTIVE
Rh Factor: POSITIVE
Rubella Antibodies, IGG: 1.02 index (ref >0.99)
WBC: 8.2 10*3/uL (ref 3.4–10.8)

## 2021-04-22 LAB — HCV INTERPRETATION

## 2021-04-22 LAB — HGB FRACTIONATION CASCADE
Hgb A2: 2.5 % (ref 1.8–3.2)
Hgb A: 97.5 % (ref 96.4–98.8)
Hgb F: 0 % (ref 0.0–2.0)
Hgb S: 0 %

## 2021-04-25 LAB — PMP SCREEN PROFILE (10S), URINE
Amphetamine Scrn, Ur: NEGATIVE ng/mL
BARBITURATE SCREEN URINE: NEGATIVE ng/mL
BENZODIAZEPINE SCREEN, URINE: NEGATIVE ng/mL
CANNABINOIDS UR QL SCN: NEGATIVE ng/mL
Cocaine (Metab) Scrn, Ur: NEGATIVE ng/mL
Creatinine(Crt), U: 199.4 mg/dL (ref 20.0–300.0)
Methadone Screen, Urine: NEGATIVE ng/mL
OXYCODONE+OXYMORPHONE UR QL SCN: NEGATIVE ng/mL
Opiate Scrn, Ur: NEGATIVE ng/mL
Ph of Urine: 5.5 (ref 4.5–8.9)
Phencyclidine Qn, Ur: NEGATIVE ng/mL
Propoxyphene Scrn, Ur: NEGATIVE ng/mL

## 2021-04-25 LAB — URINE CULTURE

## 2021-04-25 LAB — GC/CHLAMYDIA PROBE AMP
Chlamydia trachomatis, NAA: NEGATIVE
Neisseria Gonorrhoeae by PCR: NEGATIVE

## 2021-04-26 ENCOUNTER — Encounter: Payer: Self-pay | Admitting: Obstetrics & Gynecology

## 2021-05-19 ENCOUNTER — Ambulatory Visit (INDEPENDENT_AMBULATORY_CARE_PROVIDER_SITE_OTHER): Payer: BC Managed Care – PPO | Admitting: Obstetrics & Gynecology

## 2021-05-19 ENCOUNTER — Encounter: Payer: Self-pay | Admitting: Obstetrics & Gynecology

## 2021-05-19 ENCOUNTER — Ambulatory Visit (INDEPENDENT_AMBULATORY_CARE_PROVIDER_SITE_OTHER): Payer: BC Managed Care – PPO

## 2021-05-19 ENCOUNTER — Other Ambulatory Visit: Payer: Self-pay

## 2021-05-19 VITALS — BP 107/65 | HR 75 | Wt 203.2 lb

## 2021-05-19 DIAGNOSIS — Z3480 Encounter for supervision of other normal pregnancy, unspecified trimester: Secondary | ICD-10-CM

## 2021-05-19 DIAGNOSIS — Z3A19 19 weeks gestation of pregnancy: Secondary | ICD-10-CM

## 2021-05-19 DIAGNOSIS — Z363 Encounter for antenatal screening for malformations: Secondary | ICD-10-CM | POA: Diagnosis not present

## 2021-05-19 DIAGNOSIS — F419 Anxiety disorder, unspecified: Secondary | ICD-10-CM

## 2021-05-19 NOTE — Progress Notes (Signed)
   LOW-RISK PREGNANCY VISIT Patient name: Lori Donaldson MRN [id number]  Date of birth: 1985/11/04 Chief Complaint:   Routine Prenatal Visit (Anatomy scan/)  History of Present Illness:   Lori Donaldson is a 35 y.o. G2P1001 female at [redacted]w[redacted]d with an Estimated Date of Delivery: 10/10/21 being seen today for ongoing management of a low-risk pregnancy.   Depression screen [facility] 2/9 04/20/2021 03/01/2021  Decreased Interest 0 0  Down, Depressed, Hopeless 0 0  PHQ - 2 Score 0 0  Altered sleeping 0 0  Tired, decreased energy 1 1  Change in appetite 0 0  Feeling bad or failure about yourself  1 0  Trouble concentrating 0 0  Moving slowly or fidgety/restless 0 0  Suicidal thoughts 0 0  PHQ-9 Score 2 1    Today she reports no complaints. Contractions: Not present. Vag. Bleeding: None.  Movement: Present. denies leaking of fluid. Review of Systems:   Pertinent items are noted in HPI Denies abnormal vaginal discharge w/ itching/odor/irritation, headaches, visual changes, shortness of breath, chest pain, abdominal pain, severe nausea/vomiting, or problems with urination or bowel movements unless otherwise stated above. Pertinent History Reviewed:  Reviewed past medical,surgical, social, obstetrical and family history.  Reviewed problem list, medications and allergies.  Physical Assessment:   Vitals:   05/19/21 1535  BP: 107/65  Pulse: 75  Weight: 203 lb 3.2 oz (92.2 kg)  Body mass index is 34.88 kg/m.        Physical Examination:   General appearance: Well appearing, and in no distress  Mental status: Alert, oriented to person, place, and time  Skin: Warm & dry  Respiratory: Normal respiratory effort, no distress  Abdomen: Soft, gravid, nontender  Pelvic: Cervical exam deferred         Extremities: Edema: None  Psych:  mood and affect appropriate  Fetal Status:     Movement: Present  cephalic,fundal right placenta gr 0,svp of fluid 4.2 cm,normal ovaries,cx 3.6 cm,fhr 142  bpm,EFW 340 g 86%,anatomy complete,no obvious abnormalities,limited view because of pt body habitus   Chaperone: n/a    No results found for this or any previous visit (from the past 24 hour(s)).   Assessment & Plan:  1) Low-risk pregnancy G2P1001 at [redacted]w[redacted]d with an Estimated Date of Delivery: 10/10/21   2) AFP tetra today, anatomy scan completed as above   Meds: No orders of the defined types were placed in this encounter.  Labs/procedures today: anatomy scan  Plan:  Continue routine obstetrical care  Next visit: prefers in person    Reviewed: Preterm labor symptoms and general obstetric precautions including but not limited to vaginal bleeding, contractions, leaking of fluid and fetal movement were reviewed in detail with the patient.  All questions were answered. Pt has home bp cuff. Check bp weekly, let us know if >140/90.   Follow-up: No follow-ups on file.  No orders of the defined types were placed in this encounter.   Myna Hidalgo, DO Attending Obstetrician & Gynecologist, [facility] for [employer], [facility]

## 2021-05-19 NOTE — Progress Notes (Signed)
[place] 19+3 wks,cephalic,fundal right placenta gr 0,svp of fluid 4.2 cm,normal ovaries,cx 3.6 cm,fhr 142 bpm,EFW 340 g 86%,anatomy complete,no obvious abnormalities,limited view because of pt body habitus

## 2021-05-21 LAB — AFP TETRA
DIA Mom Value: 0.74
DIA Value (EIA): 105.17 pg/mL
DSR (By Age)    1 IN: 345
DSR (Second Trimester) 1 IN: 942
Gestational Age: 19.3 WEEKS
MSAFP Mom: 0.59
MSAFP: 26.4 ng/mL
MSHCG Mom: 1.02
MSHCG: 23040 m[IU]/mL
Maternal Age At EDD: 34.3 yr
Osb Risk: 10000
T18 (By Age): 1:1346 {titer}
Test Results:: NEGATIVE
Weight: 203 [lb_av]
uE3 Mom: 0.71
uE3 Value: 1.3 ng/mL

## 2021-06-17 ENCOUNTER — Ambulatory Visit (INDEPENDENT_AMBULATORY_CARE_PROVIDER_SITE_OTHER): Payer: BC Managed Care – PPO | Admitting: Women's Health

## 2021-06-17 ENCOUNTER — Encounter: Payer: Self-pay | Admitting: Women's Health

## 2021-06-17 ENCOUNTER — Other Ambulatory Visit: Payer: Self-pay

## 2021-06-17 VITALS — BP 113/77 | HR 94 | Wt 209.5 lb

## 2021-06-17 DIAGNOSIS — Z3480 Encounter for supervision of other normal pregnancy, unspecified trimester: Secondary | ICD-10-CM

## 2021-06-17 DIAGNOSIS — Z3482 Encounter for supervision of other normal pregnancy, second trimester: Secondary | ICD-10-CM

## 2021-06-17 NOTE — Progress Notes (Signed)
LOW-RISK PREGNANCY VISIT Patient name: Lori Donaldson MRN [id number]  Date of birth: February 08, 1986 Chief Complaint:   Routine Prenatal Visit (? Contractions when she over does it; ? Acid reflux)  History of Present Illness:   Lori Donaldson is a 35 y.o. G70P1001 female at [redacted]w[redacted]d with an Estimated Date of Delivery: 10/10/21 being seen today for ongoing management of a low-risk pregnancy.   Today she reports  contraction when overdoes it at work, go away when she sits down and rests, wants cx checked. Reflux, tums help . Contractions: Irritability. Vag. Bleeding: None.  Movement: Present. denies leaking of fluid.  Depression screen [facility] 2/9 04/20/2021 03/01/2021  Decreased Interest 0 0  Down, Depressed, Hopeless 0 0  PHQ - 2 Score 0 0  Altered sleeping 0 0  Tired, decreased energy 1 1  Change in appetite 0 0  Feeling bad or failure about yourself  1 0  Trouble concentrating 0 0  Moving slowly or fidgety/restless 0 0  Suicidal thoughts 0 0  PHQ-9 Score 2 1     GAD 7 : Generalized Anxiety Score 04/20/2021 03/01/2021  Nervous, Anxious, on Edge 1 0  Control/stop worrying 0 0  Worry too much - different things 0 0  Trouble relaxing 0 0  Restless 0 0  Easily annoyed or irritable 1 1  Afraid - awful might happen 0 0  Total GAD 7 Score 2 1      Review of Systems:   Pertinent items are noted in HPI Denies abnormal vaginal discharge w/ itching/odor/irritation, headaches, visual changes, shortness of breath, chest pain, abdominal pain, severe nausea/vomiting, or problems with urination or bowel movements unless otherwise stated above. Pertinent History Reviewed:  Reviewed past medical,surgical, social, obstetrical and family history.  Reviewed problem list, medications and allergies. Physical Assessment:   Vitals:   06/17/21 1119  BP: 113/77  Pulse: 94  Weight: 209 lb 8 oz (95 kg)  Body mass index is 35.96 kg/m.        Physical Examination:   General appearance: Well  appearing, and in no distress  Mental status: Alert, oriented to person, place, and time  Skin: Warm & dry  Cardiovascular: Normal heart rate noted  Respiratory: Normal respiratory effort, no distress  Abdomen: Soft, gravid, nontender  Pelvic: Cervical exam performed, LTC        Extremities: Edema: None  Fetal Status: Fetal Heart Rate (bpm): 143 Fundal Height: 25 cm Movement: Present    Chaperone: Malachy Mood   No results found for this or any previous visit (from the past 24 hour(s)).  Assessment & Plan:  1) Low-risk pregnancy G2P1001 at [redacted]w[redacted]d with an Estimated Date of Delivery: 10/10/21   2) Contractions at work when [employer] it, no cervical change, stay hydrated, rest as needed, reviewed ptl s/s, reasons to seek care  3) Reflux> TUMS, otc antacids   Meds: No orders of the defined types were placed in this encounter.  Labs/procedures today: SVE  Plan:  Continue routine obstetrical care  Next visit: prefers will be in person for pn2     Reviewed: Preterm labor symptoms and general obstetric precautions including but not limited to vaginal bleeding, contractions, leaking of fluid and fetal movement were reviewed in detail with the patient.  All questions were answered. Does have home bp cuff. Office bp cuff given: not applicable. Check bp weekly, let us know if consistently >140 and/or >90.  Follow-up: Return in about 4 weeks (around 07/15/2021) for LROB, PN2,  CNM, in person.  No future appointments.  No orders of the defined types were placed in this encounter.  Cheral Marker CNM, [facility] 06/17/2021 11:39 AM

## 2021-06-17 NOTE — Patient Instructions (Signed)
[place], thank you for choosing our office today! We appreciate the opportunity to meet your healthcare needs. You may receive a short survey by mail, e-mail, or through [employer]. If you are happy with your care we would appreciate if you could take just a few minutes to complete the survey questions. We read all of your comments and take your feedback very seriously. Thank you again for choosing our office.  Center for [employer] Team at [facility]  [facility] at [facility] ([address] [place], [place] [address]) Entrance C, located off of [address] Free 24/7 valet parking   You will have your sugar test next visit.  Please do not eat or drink anything after midnight the night before you come, not even water.  You will be here for at least two hours.  Please make an appointment online for the bloodwork at [email or web address] for 8:00am (or as close to this as possible). Make sure you select the [facility] service center.   CLASSES: Go to [email or web address] to register for classes (childbirth, breastfeeding, waterbirth, infant CPR, daddy bootcamp, etc.)  Call the office [phone]) or go to [facility] if: You begin to have strong, frequent contractions Your water breaks.  Sometimes it is a big gush of fluid, sometimes it is just a trickle that keeps getting your panties wet or running down your legs You have vaginal bleeding.  It is normal to have a small amount of spotting if your cervix was checked.  You don't feel your baby moving like normal.  If you don't, get you something to eat and drink and lay down and focus on feeling your baby move.   If your baby is still not moving like normal, you should call the office or go to [facility]  Call the office [phone]) or go to [facility] hospital for these signs of pre-eclampsia: Severe headache that does not go away with Tylenol Visual changes- seeing spots, double, blurred vision Pain under your right breast or upper  abdomen that does not go away with Tums or heartburn medicine Nausea and/or vomiting Severe swelling in your hands, feet, and face    [facility] Pediatricians/Family Doctors [facility]): [address] Colette Ribas, [phone]           [facility]: [address], [phone]                [facility]): [address], [phone] (call to ask if accepting patients) [facility] Department: [address], [place], [phone]    [facility] Pediatricians/Family Doctors [facility]): [address], [phone] [facility]: [address] [place], [phone] [facility]: [address], [phone]  [facility] Doctors  [facility]): [phone] [facility]: [address] [phone]   [facility] Doctors [facility]: [address] [phone]  [facility] Doctors  [facility]: [phone], [phone]  Home Blood Pressure Monitoring for Patients   Your provider has recommended that you check your blood pressure (BP) at least once a week at home. If you do not have a blood pressure cuff at home, one will be provided for you. Contact your provider if you have not received your monitor within 1 week.   Helpful Tips for Accurate Home Blood Pressure Checks  Don't smoke, exercise, or drink  caffeine 30 minutes before checking your BP Use the restroom before checking your BP (a full bladder can raise your pressure) Relax in a comfortable upright chair Feet on the ground Left arm resting comfortably on a flat surface at the level of your heart Legs uncrossed Back supported Sit quietly and don't talk Place the cuff on your bare arm Adjust snuggly, so that only two fingertips can fit between your skin and the top of the cuff Check 2  readings separated by at least one minute Keep a log of your BP readings For a visual, please reference this diagram: http://ccnc.care/bpdiagram  Provider Name: [facility]     Phone: [phone]  Zone 1: ALL CLEAR  Continue to monitor your symptoms:  BP reading is less than 140 (top number) or less than 90 (bottom number)  No right upper stomach pain No headaches or seeing spots No feeling nauseated or throwing up No swelling in face and hands  Zone 2: CAUTION Call your doctor's office for any of the following:  BP reading is greater than 140 (top number) or greater than 90 (bottom number)  Stomach pain under your ribs in the middle or right side Headaches or seeing spots Feeling nauseated or throwing up Swelling in face and hands  Zone 3: EMERGENCY  Seek immediate medical care if you have any of the following:  BP reading is greater than160 (top number) or greater than 110 (bottom number) Severe headaches not improving with Tylenol Serious difficulty catching your breath Any worsening symptoms from Zone 2   Second Trimester of Pregnancy The second trimester is from week 13 through week 28, months 4 through 6. The second trimester is often a time when you feel your best. Your body has also adjusted to being pregnant, and you begin to feel better physically. Usually, morning sickness has lessened or quit completely, you may have more energy, and you may have an increase in appetite. The second trimester is also a time when the fetus is growing rapidly. At the end of the sixth month, the fetus is about 9 inches long and weighs about 1 pounds. You will likely begin to feel the baby move (quickening) between 18 and 20 weeks of the pregnancy. BODY CHANGES Your body goes through many changes during pregnancy. The changes vary from woman to woman.  Your weight will continue to increase. You will notice your lower abdomen bulging out. You may begin to get stretch marks on your  hips, abdomen, and breasts. You may develop headaches that can be relieved by medicines approved by your health care provider. You may urinate more often because the fetus is pressing on your bladder. You may develop or continue to have heartburn as a result of your pregnancy. You may develop constipation because certain hormones are causing the muscles that push waste through your intestines to slow down. You may develop hemorrhoids or swollen, bulging veins (varicose veins). You may have back pain because of the weight gain and pregnancy hormones relaxing your joints between the bones in your pelvis and as a result of a shift in weight and the muscles that support your balance. Your breasts will continue to grow and be tender. Your gums may bleed and may be sensitive to brushing and flossing. Dark spots or blotches (chloasma, mask of pregnancy) may develop on your face. This will likely fade after the baby is born. A dark line from your belly button to the pubic area (linea nigra) may appear. This  will likely fade after the baby is born. You may have changes in your hair. These can include thickening of your hair, rapid growth, and changes in texture. Some women also have hair loss during or after pregnancy, or hair that feels dry or thin. Your hair will most likely return to normal after your baby is born. WHAT TO EXPECT AT YOUR PRENATAL VISITS During a routine prenatal visit: You will be weighed to make sure you and the fetus are growing normally. Your blood pressure will be taken. Your abdomen will be measured to track your baby's growth. The fetal heartbeat will be listened to. Any test results from the previous visit will be discussed. Your health care provider may ask you: How you are feeling. If you are feeling the baby move. If you have had any abnormal symptoms, such as leaking fluid, bleeding, severe headaches, or abdominal cramping. If you have any questions. Other tests that may  be performed during your second trimester include: Blood tests that check for: Low iron levels (anemia). Gestational diabetes (between 24 and 28 weeks). Rh antibodies. Urine tests to check for infections, diabetes, or protein in the urine. An ultrasound to confirm the proper growth and development of the baby. An amniocentesis to check for possible genetic problems. Fetal screens for spina bifida and Down syndrome. HOME CARE INSTRUCTIONS  Avoid all smoking, herbs, alcohol, and unprescribed drugs. These chemicals affect the formation and growth of the baby. Follow your health care provider's instructions regarding medicine use. There are medicines that are either safe or unsafe to take during pregnancy. Exercise only as directed by your health care provider. Experiencing uterine cramps is a good sign to stop exercising. Continue to eat regular, healthy meals. Wear a good support bra for breast tenderness. Do not use hot tubs, steam rooms, or saunas. Wear your seat belt at all times when driving. Avoid raw meat, uncooked cheese, cat litter boxes, and soil used by cats. These carry germs that can cause birth defects in the baby. Take your prenatal vitamins. Try taking a stool softener (if your health care provider approves) if you develop constipation. Eat more high-fiber foods, such as fresh vegetables or fruit and whole grains. Drink plenty of fluids to keep your urine clear or pale yellow. Take warm sitz baths to soothe any pain or discomfort caused by hemorrhoids. Use hemorrhoid cream if your health care provider approves. If you develop varicose veins, wear support hose. Elevate your feet for 15 minutes, 3-4 times a day. Limit salt in your diet. Avoid heavy lifting, wear low heel shoes, and practice good posture. Rest with your legs elevated if you have leg cramps or low back pain. Visit your dentist if you have not gone yet during your pregnancy. Use a soft toothbrush to brush your teeth  and be gentle when you floss. A sexual relationship may be continued unless your health care provider directs you otherwise. Continue to go to all your prenatal visits as directed by your health care provider. SEEK MEDICAL CARE IF:  You have dizziness. You have mild pelvic cramps, pelvic pressure, or nagging pain in the abdominal area. You have persistent nausea, vomiting, or diarrhea. You have a bad smelling vaginal discharge. You have pain with urination. SEEK IMMEDIATE MEDICAL CARE IF:  You have a fever. You are leaking fluid from your vagina. You have spotting or bleeding from your vagina. You have severe abdominal cramping or pain. You have rapid weight gain or loss. You have shortness of   breath with chest pain. You notice sudden or extreme swelling of your face, hands, ankles, feet, or legs. You have not felt your baby move in over an hour. You have severe headaches that do not go away with medicine. You have vision changes. Document Released: 06/20/2001 Document Revised: 07/01/2013 Document Reviewed: 08/27/2012 [facility] Patient Information 2015 [place], [place]. This information is not intended to replace advice given to you by your health care provider. Make sure you discuss any questions you have with your health care provider.

## 2021-07-10 NOTE — L&D Delivery Note (Signed)
OB/GYN Faculty Practice Delivery Note ? ?Lori Donaldson is a 36 y.o. G2P1001 s/p vag del at [redacted]w[redacted]d. She was admitted for active labor.  ? ?ROM: 5h 65m with clear fluid ?GBS Status: neg ?Maximum Maternal Temperature: 98.9 ? ?Labor Progress: ?[name] was admitted at 6cm in early active labor; she had AROM at 1631, progressed to complete and pushed approx to vag del. ? ?Delivery Date/Time: September 30, 2021 at 2159 ?Delivery: Called to room and patient was complete and pushing. Head delivered ROA. Nuchal cord present x 1 and reduced prior to delivery. Shoulder and body delivered in usual fashion. Infant with spontaneous cry, placed on mother's abdomen, dried and stimulated. Cord clamped x 2 after 1-minute delay, and cut by FOB. Cord blood drawn. Placenta delivered spontaneously with gentle cord traction- difficult to evaluate cord insertion site once placenta delivered as it began tearing from the placenta. Fundus firm with massage and Pitocin. Labia, perineum, vagina, and cervix inspected inspected and found to be intact.  ? ?Placenta: spont, intact; to L&D ?Complications: none ?Lacerations: none ?EBL: 100cc ?Analgesia: epidural ? ?Postpartum Planning ?[x]  message to sent to schedule follow-up  ? ?Infant: boy  APGARs 6/9  2980g (6lb 9.1oz) ? ?11-26-1987, CNM  ?09/30/2021 ?10:51 PM  ?

## 2021-07-14 ENCOUNTER — Other Ambulatory Visit: Payer: BC Managed Care – PPO

## 2021-07-15 ENCOUNTER — Encounter: Payer: BC Managed Care – PPO | Admitting: Advanced Practice Midwife

## 2021-07-15 ENCOUNTER — Other Ambulatory Visit: Payer: BC Managed Care – PPO

## 2021-07-22 ENCOUNTER — Other Ambulatory Visit: Payer: BC Managed Care – PPO

## 2021-07-22 ENCOUNTER — Other Ambulatory Visit: Payer: Self-pay

## 2021-07-22 DIAGNOSIS — Z131 Encounter for screening for diabetes mellitus: Secondary | ICD-10-CM

## 2021-07-22 DIAGNOSIS — Z3A28 28 weeks gestation of pregnancy: Secondary | ICD-10-CM

## 2021-07-22 DIAGNOSIS — Z3483 Encounter for supervision of other normal pregnancy, third trimester: Secondary | ICD-10-CM

## 2021-07-23 LAB — CBC
Hematocrit: 35.9 % (ref 34.0–46.6)
Hemoglobin: 12.1 g/dL (ref 11.1–15.9)
MCH: 29.7 pg (ref 26.6–33.0)
MCHC: 33.7 g/dL (ref 31.5–35.7)
MCV: 88 fL (ref 79–97)
Platelets: 286 10*3/uL (ref 150–450)
RBC: 4.08 x10E6/uL (ref 3.77–5.28)
RDW: 12.1 % (ref 11.7–15.4)
WBC: 12 10*3/uL — ABNORMAL HIGH (ref 3.4–10.8)

## 2021-07-23 LAB — ANTIBODY SCREEN: Antibody Screen: NEGATIVE

## 2021-07-23 LAB — GLUCOSE TOLERANCE, 2 HOURS W/ 1HR
Glucose, 1 hour: 137 mg/dL (ref 70–179)
Glucose, 2 hour: 123 mg/dL (ref 70–152)
Glucose, Fasting: 89 mg/dL (ref 70–91)

## 2021-07-23 LAB — RPR: RPR Ser Ql: NONREACTIVE

## 2021-07-23 LAB — HIV ANTIBODY (ROUTINE TESTING W REFLEX): HIV Screen 4th Generation wRfx: NONREACTIVE

## 2021-07-28 ENCOUNTER — Encounter: Payer: Self-pay | Admitting: Women's Health

## 2021-07-28 ENCOUNTER — Other Ambulatory Visit: Payer: Self-pay

## 2021-07-28 ENCOUNTER — Ambulatory Visit (INDEPENDENT_AMBULATORY_CARE_PROVIDER_SITE_OTHER): Payer: BC Managed Care – PPO | Admitting: Women's Health

## 2021-07-28 VITALS — BP 98/65 | HR 85 | Wt 213.0 lb

## 2021-07-28 DIAGNOSIS — Z3483 Encounter for supervision of other normal pregnancy, third trimester: Secondary | ICD-10-CM

## 2021-07-28 DIAGNOSIS — Z3480 Encounter for supervision of other normal pregnancy, unspecified trimester: Secondary | ICD-10-CM

## 2021-07-28 NOTE — Progress Notes (Signed)
LOW-RISK PREGNANCY VISIT Patient name: Lori Donaldson MRN [id number]  Date of birth: [birth year] Chief Complaint:   Routine Prenatal Visit (Tightness in belly; + pressure)  History of Present Illness:   Lori Donaldson is a 36 y.o. G46P1001 female at [redacted]w[redacted]d with an Estimated Date of Delivery: 10/10/21 being seen today for ongoing management of a low-risk pregnancy.   Today she reports  some pressure and intermittent low back pain when working, sits down and rests and goes away. None right now . Contractions: Irritability. Vag. Bleeding: None.  Movement: Present. denies leaking of fluid.  Depression screen [facility] 2/9 04/20/2021 03/01/2021  Decreased Interest 0 0  Down, Depressed, Hopeless 0 0  PHQ - 2 Score 0 0  Altered sleeping 0 0  Tired, decreased energy 1 1  Change in appetite 0 0  Feeling bad or failure about yourself  1 0  Trouble concentrating 0 0  Moving slowly or fidgety/restless 0 0  Suicidal thoughts 0 0  PHQ-9 Score 2 1     GAD 7 : Generalized Anxiety Score 04/20/2021 03/01/2021  Nervous, Anxious, on Edge 1 0  Control/stop worrying 0 0  Worry too much - different things 0 0  Trouble relaxing 0 0  Restless 0 0  Easily annoyed or irritable 1 1  Afraid - awful might happen 0 0  Total GAD 7 Score 2 1      Review of Systems:   Pertinent items are noted in HPI Denies abnormal vaginal discharge w/ itching/odor/irritation, headaches, visual changes, shortness of breath, chest pain, abdominal pain, severe nausea/vomiting, or problems with urination or bowel movements unless otherwise stated above. Pertinent History Reviewed:  Reviewed past medical,surgical, social, obstetrical and family history.  Reviewed problem list, medications and allergies. Physical Assessment:   Vitals:   07/28/21 1023  BP: 98/65  Pulse: 85  Weight: 213 lb (96.6 kg)  Body mass index is 36.56 kg/m.        Physical Examination:   General appearance: Well appearing, and in no  distress  Mental status: Alert, oriented to person, place, and time  Skin: Warm & dry  Cardiovascular: Normal heart rate noted  Respiratory: Normal respiratory effort, no distress  Abdomen: Soft, gravid, nontender  Pelvic: Cervical exam deferred         Extremities: Edema: None  Fetal Status: Fetal Heart Rate (bpm): 135 Fundal Height: 28 cm Movement: Present    Chaperone: N/A   No results found for this or any previous visit (from the past 24 hour(s)).  Assessment & Plan:  1) Low-risk pregnancy G2P1001 at 100w3d with an Estimated Date of Delivery: 10/10/21   2) Some pelvic pressure, intermittent back pain, while working, take breaks as needed, reviewed ptl s/s, reasons to seek care   Meds: No orders of the defined types were placed in this encounter.  Labs/procedures today: none  Plan:  Continue routine obstetrical care  Next visit: prefers in person    Reviewed: Preterm labor symptoms and general obstetric precautions including but not limited to vaginal bleeding, contractions, leaking of fluid and fetal movement were reviewed in detail with the patient.  All questions were answered. Does have home bp cuff. Office bp cuff given: not applicable. Check bp weekly, let us know if consistently >140 and/or >90.  Follow-up: Return in about 3 weeks (around 08/18/2021) for LROB, CNM, in person.  Future Appointments  Date Time Provider Department Center  08/18/2021 11:10 AM Myna Hidalgo, DO [facility]  No orders of the defined types were placed in this encounter.  Cheral Marker CNM, [facility] 07/28/2021 11:01 AM

## 2021-07-28 NOTE — Patient Instructions (Signed)
Lori Donaldson, thank you for choosing our office today! We appreciate the opportunity to meet your healthcare needs. You may receive a short survey by mail, e-mail, or through MyChart. If you are happy with your care we would appreciate if you could take just a few minutes to complete the survey questions. We read all of your comments and take your feedback very seriously. Thank you again for choosing our office.  [facility] ([address] [place], [place] [address]) Entrance C, located off of [address] Free 24/7 valet parking   CLASSES: Go to [email or web address] to register for classes (childbirth, breastfeeding, waterbirth, infant CPR, daddy bootcamp, etc.)  Call the office ([phone]) or go to [facility] if: You begin to have strong, frequent contractions Your water breaks.  Sometimes it is a big gush of fluid, sometimes it is just a trickle that keeps getting your panties wet or running down your legs You have vaginal bleeding.  It is normal to have a small amount of spotting if your cervix was checked.  You don't feel your baby moving like normal.  If you don't, get you something to eat and drink and lay down and focus on feeling your baby move.   If your baby is still not moving like normal, you should call the office or go to [facility].  Call the office ([phone]) or go to [facility] for these signs of pre-eclampsia: Severe headache that does not go away with Tylenol Visual changes- seeing spots, double, blurred vision Pain under your right breast or upper abdomen that does not go away with Tums or heartburn medicine Nausea and/or vomiting Severe swelling in your hands, feet, and face   Tdap Vaccine It is recommended that you get the Tdap vaccine during the third trimester of EACH pregnancy to help protect your baby from getting pertussis (whooping cough) 27-36 weeks is the BEST time to do  this so that you can pass the protection on to your baby. During pregnancy is better than after pregnancy, but if you are unable to get it during pregnancy it will be offered at the hospital.  You can get this vaccine with us, at the health department, your family doctor, or some local pharmacies Everyone who will be around your baby should also be up-to-date on their vaccines before the baby comes. Adults (who are not pregnant) only need 1 dose of Tdap during adulthood.   [place] Pediatricians/Family Doctors [facility] ([facility]): [address], [phone]           [facility]: [address], [phone]                [facility] ([facility]): [address], [phone] (call to ask if accepting patients) [facility]: [address], [place], [phone]    [place] Pediatricians/Family Doctors [facility] ([facility]): [address], [phone] [facility]: [address], [phone] [facility]: [address], [phone]  [place] [facility]  [facility] ([facility]): [phone] [facility]: [address], [phone]   [place] Family Doctors [facility]: [address], [phone]  [place] [facility]  [facility]: [address], [phone]  Home Blood Pressure Monitoring for Patients   Your provider has recommended that you check your   blood pressure (BP) at least once a week at home. If you do not have a blood pressure cuff at home, one will be provided for you. Contact your provider if you have not received your monitor within 1 week.   Helpful Tips for Accurate Home Blood Pressure Checks  Don't smoke, exercise, or drink caffeine 30 minutes before checking your BP Use the restroom before checking your BP (a full bladder can raise your  pressure) Relax in a comfortable upright chair Feet on the ground Left arm resting comfortably on a flat surface at the level of your heart Legs uncrossed Back supported Sit quietly and don't talk Place the cuff on your bare arm Adjust snuggly, so that only two fingertips can fit between your skin and the top of the cuff Check 2 readings separated by at least one minute Keep a log of your BP readings For a visual, please reference this diagram: http://ccnc.care/bpdiagram  Provider Name: [facility]     Phone: [phone]  Zone 1: ALL CLEAR  Continue to monitor your symptoms:  BP reading is less than 140 (top number) or less than 90 (bottom number)  No right upper stomach pain No headaches or seeing spots No feeling nauseated or throwing up No swelling in face and hands  Zone 2: CAUTION Call your doctor's office for any of the following:  BP reading is greater than 140 (top number) or greater than 90 (bottom number)  Stomach pain under your ribs in the middle or right side Headaches or seeing spots Feeling nauseated or throwing up Swelling in face and hands  Zone 3: EMERGENCY  Seek immediate medical care if you have any of the following:  BP reading is greater than160 (top number) or greater than 110 (bottom number) Severe headaches not improving with Tylenol Serious difficulty catching your breath Any worsening symptoms from Zone 2   Third Trimester of Pregnancy The third trimester is from week 29 through week 42, months 7 through 9. The third trimester is a time when the fetus is growing rapidly. At the end of the ninth month, the fetus is about 20 inches in length and weighs 6-10 pounds.  BODY CHANGES Your body goes through many changes during pregnancy. The changes vary from woman to woman.  Your weight will continue to increase. You can expect to gain 25-35 pounds (11-16 kg) by the end of the pregnancy. You may begin to get stretch marks on your hips, abdomen,  and breasts. You may urinate more often because the fetus is moving lower into your pelvis and pressing on your bladder. You may develop or continue to have heartburn as a result of your pregnancy. You may develop constipation because certain hormones are causing the muscles that push waste through your intestines to slow down. You may develop hemorrhoids or swollen, bulging veins (varicose veins). You may have pelvic pain because of the weight gain and pregnancy hormones relaxing your joints between the bones in your pelvis. Backaches may result from overexertion of the muscles supporting your posture. You may have changes in your hair. These can include thickening of your hair, rapid growth, and changes in texture. Some women also have hair loss during or after pregnancy, or hair that feels dry or thin. Your hair will most likely return to normal after your baby is born. Your breasts will continue to grow and be tender. A yellow discharge may leak from your breasts called colostrum. Your belly button may stick out. You may   feel short of breath because of your expanding uterus. You may notice the fetus "dropping," or moving lower in your abdomen. You may have a bloody mucus discharge. This usually occurs a few days to a week before labor begins. Your cervix becomes thin and soft (effaced) near your due date. WHAT TO EXPECT AT YOUR PRENATAL EXAMS  You will have prenatal exams every 2 weeks until week 36. Then, you will have weekly prenatal exams. During a routine prenatal visit: You will be weighed to make sure you and the fetus are growing normally. Your blood pressure is taken. Your abdomen will be measured to track your baby's growth. The fetal heartbeat will be listened to. Any test results from the previous visit will be discussed. You may have a cervical check near your due date to see if you have effaced. At around 36 weeks, your caregiver will check your cervix. At the same time, your  caregiver will also perform a test on the secretions of the vaginal tissue. This test is to determine if a type of bacteria, Group B streptococcus, is present. Your caregiver will explain this further. Your caregiver may ask you: What your birth plan is. How you are feeling. If you are feeling the baby move. If you have had any abnormal symptoms, such as leaking fluid, bleeding, severe headaches, or abdominal cramping. If you have any questions. Other tests or screenings that may be performed during your third trimester include: Blood tests that check for low iron levels (anemia). Fetal testing to check the health, activity level, and growth of the fetus. Testing is done if you have certain medical conditions or if there are problems during the pregnancy. FALSE LABOR You may feel small, irregular contractions that eventually go away. These are called Braxton Hicks contractions, or false labor. Contractions may last for hours, days, or even weeks before true labor sets in. If contractions come at regular intervals, intensify, or become painful, it is best to be seen by your caregiver.  SIGNS OF LABOR  Menstrual-like cramps. Contractions that are 5 minutes apart or less. Contractions that start on the top of the uterus and spread down to the lower abdomen and back. A sense of increased pelvic pressure or back pain. A watery or bloody mucus discharge that comes from the vagina. If you have any of these signs before the 37th week of pregnancy, call your caregiver right away. You need to go to the hospital to get checked immediately. HOME CARE INSTRUCTIONS  Avoid all smoking, herbs, alcohol, and unprescribed drugs. These chemicals affect the formation and growth of the baby. Follow your caregiver's instructions regarding medicine use. There are medicines that are either safe or unsafe to take during pregnancy. Exercise only as directed by your caregiver. Experiencing uterine cramps is a good sign to  stop exercising. Continue to eat regular, healthy meals. Wear a good support bra for breast tenderness. Do not use hot tubs, steam rooms, or saunas. Wear your seat belt at all times when driving. Avoid raw meat, uncooked cheese, cat litter boxes, and soil used by cats. These carry germs that can cause birth defects in the baby. Take your prenatal vitamins. Try taking a stool softener (if your caregiver approves) if you develop constipation. Eat more high-fiber foods, such as fresh vegetables or fruit and whole grains. Drink plenty of fluids to keep your urine clear or pale yellow. Take warm sitz baths to soothe any pain or discomfort caused by hemorrhoids. Use hemorrhoid cream if   your caregiver approves. If you develop varicose veins, wear support hose. Elevate your feet for 15 minutes, 3-4 times a day. Limit salt in your diet. Avoid heavy lifting, wear low heal shoes, and practice good posture. Rest a lot with your legs elevated if you have leg cramps or low back pain. Visit your dentist if you have not gone during your pregnancy. Use a soft toothbrush to brush your teeth and be gentle when you floss. A sexual relationship may be continued unless your caregiver directs you otherwise. Do not travel far distances unless it is absolutely necessary and only with the approval of your caregiver. Take prenatal classes to understand, practice, and ask questions about the labor and delivery. Make a trial run to the hospital. Pack your hospital bag. Prepare the baby's nursery. Continue to go to all your prenatal visits as directed by your caregiver. SEEK MEDICAL CARE IF: You are unsure if you are in labor or if your water has broken. You have dizziness. You have mild pelvic cramps, pelvic pressure, or nagging pain in your abdominal area. You have persistent nausea, vomiting, or diarrhea. You have a bad smelling vaginal discharge. You have pain with urination. SEEK IMMEDIATE MEDICAL CARE IF:  You  have a fever. You are leaking fluid from your vagina. You have spotting or bleeding from your vagina. You have severe abdominal cramping or pain. You have rapid weight loss or gain. You have shortness of breath with chest pain. You notice sudden or extreme swelling of your face, hands, ankles, feet, or legs. You have not felt your baby move in over an hour. You have severe headaches that do not go away with medicine. You have vision changes. Document Released: 06/20/2001 Document Revised: 07/01/2013 Document Reviewed: 08/27/2012 ExitCare Patient Information 2015 [employer]. This information is not intended to replace advice given to you by your health care provider. Make sure you discuss any questions you have with your health care provider.       

## 2021-08-18 ENCOUNTER — Ambulatory Visit (INDEPENDENT_AMBULATORY_CARE_PROVIDER_SITE_OTHER): Payer: BC Managed Care – PPO | Admitting: Obstetrics & Gynecology

## 2021-08-18 ENCOUNTER — Encounter: Payer: Self-pay | Admitting: Obstetrics & Gynecology

## 2021-08-18 ENCOUNTER — Other Ambulatory Visit: Payer: Self-pay

## 2021-08-18 VITALS — BP 117/78 | HR 72 | Wt 214.2 lb

## 2021-08-18 DIAGNOSIS — Z3483 Encounter for supervision of other normal pregnancy, third trimester: Secondary | ICD-10-CM

## 2021-08-18 DIAGNOSIS — Z23 Encounter for immunization: Secondary | ICD-10-CM | POA: Diagnosis not present

## 2021-08-18 NOTE — Progress Notes (Signed)
° °  LOW-RISK PREGNANCY VISIT Patient name: Halcyon Heck MRN [id number]  Date of birth: 02/12/1986 Chief Complaint:   Routine Prenatal Visit  History of Present Illness:   Drinda Belgard is a 36 y.o. G24P1001 female at [redacted]w[redacted]d with an Estimated Date of Delivery: 10/10/21 being seen today for ongoing management of a low-risk pregnancy.   Depression screen [facility] 2/9 04/20/2021 03/01/2021  Decreased Interest 0 0  Down, Depressed, Hopeless 0 0  PHQ - 2 Score 0 0  Altered sleeping 0 0  Tired, decreased energy 1 1  Change in appetite 0 0  Feeling bad or failure about yourself  1 0  Trouble concentrating 0 0  Moving slowly or fidgety/restless 0 0  Suicidal thoughts 0 0  PHQ-9 Score 2 1    Today she reports  pelvic pressure . Contractions: Irritability. Vag. Bleeding: None.  Movement: Present. denies leaking of fluid. Review of Systems:   Pertinent items are noted in HPI Denies abnormal vaginal discharge w/ itching/odor/irritation, headaches, visual changes, shortness of breath, chest pain, abdominal pain, severe nausea/vomiting, or problems with urination or bowel movements unless otherwise stated above. Pertinent History Reviewed:  Reviewed past medical,surgical, social, obstetrical and family history.  Reviewed problem list, medications and allergies.  Physical Assessment:   Vitals:   08/18/21 1053  BP: 117/78  Pulse: 72  Weight: 214 lb 3.2 oz (97.2 kg)  Body mass index is 36.77 kg/m.        Physical Examination:   General appearance: Well appearing, and in no distress  Mental status: Alert, oriented to person, place, and time  Skin: Warm & dry  Respiratory: Normal respiratory effort, no distress  Abdomen: Soft, gravid, nontender  Pelvic: Cervical exam deferred         Extremities: Edema: None  Psych:  mood and affect appropriate  Fetal Status: Fetal Heart Rate (bpm): 130 Fundal Height: 32 cm Movement: Present    Chaperone: n/a    No results found for this or any  previous visit (from the past 24 hour(s)).   Assessment & Plan:  1) Low-risk pregnancy G2P1001 at [redacted]w[redacted]d with an Estimated Date of Delivery: 10/10/21   -reviewed conservative options to manage pelvic pressure including rest and belly band/support   Meds: No orders of the defined types were placed in this encounter.  Labs/procedures today: none  Plan:  Continue routine obstetrical care  Next visit: prefers in person    Reviewed: Preterm labor symptoms and general obstetric precautions including but not limited to vaginal bleeding, contractions, leaking of fluid and fetal movement were reviewed in detail with the patient.  All questions were answered. Pt has home bp cuff. Check bp weekly, let us know if >140/90.   Follow-up: Return in about 2 weeks (around 09/01/2021) for LROB visit (Deondra Wigger if possible).  Orders Placed This Encounter  Procedures   Tdap vaccine greater than or equal to 7yo IM    Myna Hidalgo, DO Attending Obstetrician & Gynecologist, [facility] for [employer], [facility]

## 2021-09-01 ENCOUNTER — Other Ambulatory Visit: Payer: Self-pay

## 2021-09-01 ENCOUNTER — Ambulatory Visit (INDEPENDENT_AMBULATORY_CARE_PROVIDER_SITE_OTHER): Payer: BC Managed Care – PPO | Admitting: Obstetrics & Gynecology

## 2021-09-01 ENCOUNTER — Encounter: Payer: Self-pay | Admitting: Obstetrics & Gynecology

## 2021-09-01 VITALS — BP 102/71 | HR 78 | Wt 215.2 lb

## 2021-09-01 DIAGNOSIS — Z3483 Encounter for supervision of other normal pregnancy, third trimester: Secondary | ICD-10-CM

## 2021-09-01 NOTE — Progress Notes (Signed)
° °  LOW-RISK PREGNANCY VISIT Patient name: Lori Donaldson MRN [id number]  Date of birth: 03/20/1986 Chief Complaint:   Routine Prenatal Visit  History of Present Illness:   Lori Donaldson is a 36 y.o. G28P1001 female at [redacted]w[redacted]d with an Estimated Date of Delivery: 10/10/21 being seen today for ongoing management of a low-risk pregnancy.   Depression screen [facility] 2/9 04/20/2021 03/01/2021  Decreased Interest 0 0  Down, Depressed, Hopeless 0 0  PHQ - 2 Score 0 0  Altered sleeping 0 0  Tired, decreased energy 1 1  Change in appetite 0 0  Feeling bad or failure about yourself  1 0  Trouble concentrating 0 0  Moving slowly or fidgety/restless 0 0  Suicidal thoughts 0 0  PHQ-9 Score 2 1    Today she reports occasional tingling/numbness in right arm- reviewed conservative options.  Contractions: Irritability. Vag. Bleeding: None.  Movement: Present. denies leaking of fluid. Review of Systems:   Pertinent items are noted in HPI Denies abnormal vaginal discharge w/ itching/odor/irritation, headaches, visual changes, shortness of breath, chest pain, abdominal pain, severe nausea/vomiting, or problems with urination or bowel movements unless otherwise stated above. Pertinent History Reviewed:  Reviewed past medical,surgical, social, obstetrical and family history.  Reviewed problem list, medications and allergies.  Physical Assessment:   Vitals:   09/01/21 1053  BP: 102/71  Pulse: 78  Weight: 215 lb 3.2 oz (97.6 kg)  Body mass index is 36.94 kg/m.        Physical Examination:   General appearance: Well appearing, and in no distress  Mental status: Alert, oriented to person, place, and time  Skin: Warm & dry  Respiratory: Normal respiratory effort, no distress  Abdomen: Soft, gravid, nontender  Pelvic: Cervical exam deferred         Extremities: Edema: None  Psych:  mood and affect appropriate  Fetal Status:     Movement: Present    Chaperone: n/a    No results found for  this or any previous visit (from the past 24 hour(s)).   Assessment & Plan:  1) Low-risk pregnancy G2P1001 at [redacted]w[redacted]d with an Estimated Date of Delivery: 10/10/21   Meds: No orders of the defined types were placed in this encounter.  Labs/procedures today: none  Plan:  Continue routine obstetrical care  Next visit: prefers in person    Reviewed: Preterm labor symptoms and general obstetric precautions including but not limited to vaginal bleeding, contractions, leaking of fluid and fetal movement were reviewed in detail with the patient.  All questions were answered. Pt has home bp cuff. Check bp weekly, let us know if >140/90.   Follow-up: Return in about 2 weeks (around 09/15/2021) for with Dr. Charlotta Newton, [facility] visit.  No orders of the defined types were placed in this encounter.   Myna Hidalgo, DO Attending Obstetrician & Gynecologist, [facility] for [employer], [facility]

## 2021-09-14 ENCOUNTER — Other Ambulatory Visit: Payer: Self-pay

## 2021-09-14 ENCOUNTER — Ambulatory Visit (INDEPENDENT_AMBULATORY_CARE_PROVIDER_SITE_OTHER): Payer: BC Managed Care – PPO | Admitting: Obstetrics & Gynecology

## 2021-09-14 ENCOUNTER — Other Ambulatory Visit (HOSPITAL_COMMUNITY)
Admission: RE | Admit: 2021-09-14 | Discharge: 2021-09-14 | Disposition: A | Discharge status: Home / Self Care | Payer: BC Managed Care – PPO | Source: Ambulatory Visit | Attending: Obstetrics & Gynecology | Admitting: Obstetrics & Gynecology

## 2021-09-14 ENCOUNTER — Encounter: Payer: Self-pay | Admitting: Obstetrics & Gynecology

## 2021-09-14 VITALS — BP 120/80 | HR 90 | Wt 219.8 lb

## 2021-09-14 DIAGNOSIS — Z3480 Encounter for supervision of other normal pregnancy, unspecified trimester: Secondary | ICD-10-CM

## 2021-09-14 DIAGNOSIS — Z3A36 36 weeks gestation of pregnancy: Secondary | ICD-10-CM

## 2021-09-14 LAB — OB RESULTS CONSOLE GC/CHLAMYDIA: Gonorrhea: NEGATIVE

## 2021-09-14 NOTE — Progress Notes (Signed)
? ?  LOW-RISK PREGNANCY VISIT ?Patient name: Lori Donaldson MRN [id number]  Date of birth: 1985/12/27 ?Chief Complaint:   ?Routine Prenatal Visit (GBS, GC/CHL; + pressure) ? ?History of Present Illness:   ?Lori Donaldson is a 36 y.o. G69P1001 female at [redacted]w[redacted]d with an Estimated Date of Delivery: 10/10/21 being seen today for ongoing management of a low-risk pregnancy.  ?Depression screen [facility] 2/9 04/20/2021 03/01/2021  ?Decreased Interest 0 0  ?Down, Depressed, Hopeless 0 0  ?PHQ - 2 Score 0 0  ?Altered sleeping 0 0  ?Tired, decreased energy 1 1  ?Change in appetite 0 0  ?Feeling bad or failure about yourself  1 0  ?Trouble concentrating 0 0  ?Moving slowly or fidgety/restless 0 0  ?Suicidal thoughts 0 0  ?PHQ-9 Score 2 1  ? ? ?Today she reports  pelvic pressure . Contractions: Irritability. Vag. Bleeding: None.  Movement: Present. denies leaking of fluid. ?Review of Systems:   ?Pertinent items are noted in HPI ?Denies abnormal vaginal discharge w/ itching/odor/irritation, headaches, visual changes, shortness of breath, chest pain, abdominal pain, severe nausea/vomiting, or problems with urination or bowel movements unless otherwise stated above. ?Pertinent History Reviewed:  ?Reviewed past medical,surgical, social, obstetrical and family history.  ?Reviewed problem list, medications and allergies. ? ?Physical Assessment:  ? ?Vitals:  ? 09/14/21 0837  ?BP: 120/80  ?Pulse: 90  ?Weight: 99.7 kg  ?Body mass index is 37.73 kg/m?. ?  ?     Physical Examination:  ? General appearance: Well appearing, and in no distress ? Mental status: Alert, oriented to person, place, and time ? Skin: Warm & dry ? Respiratory: Normal respiratory effort, no distress ? Abdomen: Soft, gravid, nontender ? Pelvic: Cervical exam performed  Dilation: 1.5 Effacement (%): 20 Station: -3 ? Extremities: Edema: Trace ? Psych:  mood and affect appropriate ? ?Fetal Status: Fetal Heart Rate (bpm): 150 Fundal Height: 35 cm Movement: Present  Presentation: Vertex ? ?Chaperone: Malachy Mood   ? ?No results found for this or any previous visit (from the past 24 hour(s)).  ? ?Assessment & Plan:  ?1) Low-risk pregnancy G2P1001 at [redacted]w[redacted]d with an Estimated Date of Delivery: 10/10/21  ? ?Discussed elective IOL at term ?  ?Meds: No orders of the defined types were placed in this encounter. ? ?Labs/procedures today: GBS, GC/C collected ? ?Plan:  Continue routine obstetrical care  ?Next visit: prefers in person   ? ?Reviewed: Preterm labor symptoms and general obstetric precautions including but not limited to vaginal bleeding, contractions, leaking of fluid and fetal movement were reviewed in detail with the patient.  All questions were answered. Pt has home bp cuff. Check bp weekly, let us know if >140/90.  ? ?Follow-up: Return in about 1 week (around 09/21/2021) for LROB visit (with Dr. Charlotta Newton if possible- ok to schedule weekly x 4wks). ? ?Orders Placed This Encounter  ?Procedures  ? Strep Gp B NAA  ? ? ?Myna Hidalgo, DO ?Attending Obstetrician & Gynecologist, Faculty Practice ?Center for [employer], [facility] ? ? ? ?

## 2021-09-15 LAB — CERVICOVAGINAL ANCILLARY ONLY
Chlamydia: NEGATIVE
Comment: NEGATIVE
Comment: NORMAL
Neisseria Gonorrhea: NEGATIVE

## 2021-09-16 ENCOUNTER — Encounter (HOSPITAL_COMMUNITY): Payer: Self-pay | Admitting: Obstetrics and Gynecology

## 2021-09-16 ENCOUNTER — Other Ambulatory Visit: Payer: Self-pay

## 2021-09-16 ENCOUNTER — Inpatient Hospital Stay (HOSPITAL_COMMUNITY)
Admission: AD | Admit: 2021-09-16 | Discharge: 2021-09-16 | Disposition: A | Discharge status: Home / Self Care | Payer: BC Managed Care – PPO | Attending: Obstetrics and Gynecology | Admitting: Obstetrics and Gynecology

## 2021-09-16 DIAGNOSIS — S50311A Abrasion of right elbow, initial encounter: Secondary | ICD-10-CM | POA: Insufficient documentation

## 2021-09-16 DIAGNOSIS — Z3689 Encounter for other specified antenatal screening: Secondary | ICD-10-CM

## 2021-09-16 DIAGNOSIS — S80211A Abrasion, right knee, initial encounter: Secondary | ICD-10-CM | POA: Insufficient documentation

## 2021-09-16 DIAGNOSIS — S8001XA Contusion of right knee, initial encounter: Secondary | ICD-10-CM | POA: Insufficient documentation

## 2021-09-16 DIAGNOSIS — W1830XA Fall on same level, unspecified, initial encounter: Secondary | ICD-10-CM | POA: Diagnosis not present

## 2021-09-16 DIAGNOSIS — O9A213 Injury, poisoning and certain other consequences of external causes complicating pregnancy, third trimester: Secondary | ICD-10-CM | POA: Insufficient documentation

## 2021-09-16 DIAGNOSIS — Y9289 Other specified places as the place of occurrence of the external cause: Secondary | ICD-10-CM | POA: Insufficient documentation

## 2021-09-16 DIAGNOSIS — Z043 Encounter for examination and observation following other accident: Secondary | ICD-10-CM | POA: Diagnosis not present

## 2021-09-16 DIAGNOSIS — W19XXXA Unspecified fall, initial encounter: Secondary | ICD-10-CM

## 2021-09-16 DIAGNOSIS — Y990 Civilian activity done for income or pay: Secondary | ICD-10-CM | POA: Insufficient documentation

## 2021-09-16 DIAGNOSIS — Z3480 Encounter for supervision of other normal pregnancy, unspecified trimester: Secondary | ICD-10-CM

## 2021-09-16 DIAGNOSIS — F419 Anxiety disorder, unspecified: Secondary | ICD-10-CM

## 2021-09-16 DIAGNOSIS — Z3A26 26 weeks gestation of pregnancy: Secondary | ICD-10-CM | POA: Diagnosis not present

## 2021-09-16 DIAGNOSIS — Z3A36 36 weeks gestation of pregnancy: Secondary | ICD-10-CM | POA: Insufficient documentation

## 2021-09-16 HISTORY — DX: Unspecified abnormal cytological findings in specimens from vagina: R87.629

## 2021-09-16 HISTORY — DX: Unspecified asthma, uncomplicated: J45.909

## 2021-09-16 LAB — STREP GP B NAA: Strep Gp B NAA: NEGATIVE

## 2021-09-16 NOTE — MAU Provider Note (Signed)
Event Date/Time  ? First Provider Initiated Contact with Patient 09/16/21 1722   ?  ?S ?Ms. Lori Donaldson is a 36 y.o. G62P1001 pregnant female at [redacted]w[redacted]d who presents to MAU today after falling at work. She caught her foot and fell forward on her right side. Her knee and elbow caught the brunt of the fall, she then went down on to her right side. Does not think she hit her belly. Notes [facility] when she gets too busy at work or needs to take a break, has not had any since the fall. Denies cramping, contractions, decreased fetal movement or loss of fluid. No other physical complaints. ? ?Pertinent items noted in HPI and remainder of comprehensive ROS otherwise negative.  ? ?Receives prenatal care at [facility]. ? ?O ?BP 128/88   Pulse 86   Temp 98.2 ?F (36.8 ?C)   Resp 18   Ht 5\' 4"  (1.626 m)   Wt 223 lb (101.2 kg)   LMP 12/27/2020 (Exact Date)   SpO2 99%   BMI 38.28 kg/m?  ?Physical Exam ?Vitals and nursing note reviewed.  ?Constitutional:   ?   General: She is not in acute distress. ?   Appearance: Normal appearance. She is not ill-appearing.  ?HENT:  ?   Head: Normocephalic and atraumatic.  ?   Mouth/Throat:  ?   Mouth: Mucous membranes are moist.  ?Eyes:  ?   Pupils: Pupils are equal, round, and reactive to light.  ?Cardiovascular:  ?   Rate and Rhythm: Normal rate and regular rhythm.  ?   Pulses: Normal pulses.  ?Pulmonary:  ?   Effort: Pulmonary effort is normal.  ?Abdominal:  ?   Palpations: Abdomen is soft.  ?   Tenderness: There is no abdominal tenderness.  ?Musculoskeletal:     ?   General: Signs of injury (mild abrasions and bruising to right elbow and right knee) present. Normal range of motion.  ?   Cervical back: Normal range of motion.  ?Skin: ?   General: Skin is warm.  ?   Capillary Refill: Capillary refill takes less than 2 seconds.  ?   Findings: Bruising (to right knee) present.  ?Neurological:  ?   Mental Status: She is alert and oriented to person, place, and time.   ?Psychiatric:     ?   Mood and Affect: Mood normal.     ?   Behavior: Behavior normal.     ?   Thought Content: Thought content normal.     ?   Judgment: Judgment normal.  ? ?Fetal Tracing: reactive ?Baseline: 145 ?Variability: moderate ?Accelerations: 15x15 ?Decelerations: none ?Toco: none  ? ?MDM/MAU Course ?Denies any abdominal pain and cramping, normally has a very reactive uterus that is not contracting now. NST reactive and baby moving normally. No direct hit to the belly, pt stable for discharge after completion of NST. Discussed reasons for return to MAU. ? ?A ?Fall, initial encounter ?NST reactive ?[redacted] weeks gestation of pregnancy ? ?P ?Discharge from MAU in stable condition with return precautions ?Follow up at [facility] as scheduled for ongoing prenatal care. ? ?Gabriel Carina, CNM ?09/16/2021 5:30 PM  ? ?

## 2021-09-16 NOTE — MAU Note (Addendum)
.  Lori Donaldson is a 36 y.o. at [redacted]w[redacted]d here in MAU reporting: she caught her foot  and fell forward on her right side. Does not think she hit her stomach.  Scraped her elbow and knee. Has felt baby move since the fall. Denies any abd pain or cramping  just discomfort to knee and elbow. Denies any vag bleeding or leaking. ?Onset of complaint: 315pm ?Pain score: 7 ?Vitals:  ? 09/16/21 1637  ?BP: 128/88  ?Pulse: 86  ?Resp: 18  ?Temp: 98.2 ?F (36.8 ?C)  ?SpO2: 99%  ?   ?FHT:140 ?Lab orders placed from triage:   ? ?

## 2021-09-16 NOTE — MAU Note (Signed)
Fell at work, abrasion on rt knee and elbow, no bruising or active bleeding noted.  "Just sore' on knee and elbow.  ?

## 2021-09-21 ENCOUNTER — Encounter: Payer: Self-pay | Admitting: Obstetrics & Gynecology

## 2021-09-21 ENCOUNTER — Ambulatory Visit (INDEPENDENT_AMBULATORY_CARE_PROVIDER_SITE_OTHER): Payer: BC Managed Care – PPO | Admitting: Obstetrics & Gynecology

## 2021-09-21 ENCOUNTER — Other Ambulatory Visit: Payer: Self-pay

## 2021-09-21 VITALS — BP 122/84 | HR 120 | Wt 223.0 lb

## 2021-09-21 DIAGNOSIS — Z3483 Encounter for supervision of other normal pregnancy, third trimester: Secondary | ICD-10-CM

## 2021-09-21 NOTE — Progress Notes (Signed)
? ?  LOW-RISK PREGNANCY VISIT ?Patient name: Lori Donaldson MRN [id number]  Date of birth: 1986-02-17 ?Chief Complaint:   ?Routine Prenatal Visit ? ?History of Present Illness:   ?Lori Donaldson is a 36 y.o. G76P1001 female at [redacted]w[redacted]d with an Estimated Date of Delivery: 10/10/21 being seen today for ongoing management of a low-risk pregnancy.  ?Depression screen [facility] 2/9 04/20/2021 03/01/2021  ?Decreased Interest 0 0  ?Down, Depressed, Hopeless 0 0  ?PHQ - 2 Score 0 0  ?Altered sleeping 0 0  ?Tired, decreased energy 1 1  ?Change in appetite 0 0  ?Feeling bad or failure about yourself  1 0  ?Trouble concentrating 0 0  ?Moving slowly or fidgety/restless 0 0  ?Suicidal thoughts 0 0  ?PHQ-9 Score 2 1  ? ? ?Today she reports  pelvic pressure . Contractions: Irritability. Vag. Bleeding: None.  Movement: Present. denies leaking of fluid. ?Review of Systems:   ?Pertinent items are noted in HPI ?Denies abnormal vaginal discharge w/ itching/odor/irritation, headaches, visual changes, shortness of breath, chest pain, abdominal pain, severe nausea/vomiting, or problems with urination or bowel movements unless otherwise stated above. ?Pertinent History Reviewed:  ?Reviewed past medical,surgical, social, obstetrical and family history.  ?Reviewed problem list, medications and allergies. ? ?Physical Assessment:  ? ?Vitals:  ? 09/21/21 1358  ?BP: 122/84  ?Pulse: (!) 120  ?Weight: 223 lb (101.2 kg)  ?Body mass index is 38.28 kg/m?. ?  ?     Physical Examination:  ? General appearance: Well appearing, and in no distress ? Mental status: Alert, oriented to person, place, and time ? Skin: Warm & dry ? Respiratory: Normal respiratory effort, no distress ? Abdomen: Soft, gravid, nontender ? Pelvic: Cervical exam performed  Dilation: 1 Effacement (%): 20 Station: -3 ? Extremities: Edema: Trace ? Psych:  mood and affect appropriate ? ?Fetal Status: Fetal Heart Rate (bpm): 145 Fundal Height: 36 cm Movement: Present Presentation:  Vertex ? ?Chaperone:  declined    ? ?No results found for this or any previous visit (from the past 24 hour(s)).  ? ?Assessment & Plan:  ?1) Low-risk pregnancy G2P1001 at [redacted]w[redacted]d with an Estimated Date of Delivery: 10/10/21  ? ?2) continue expectant management ?-elective IOL scheduled 4/3 @ midnight ?  ?Meds: No orders of the defined types were placed in this encounter. ? ?Labs/procedures today: none ? ?Plan:  Continue routine obstetrical care  ?Next visit: prefers in person   ? ?Reviewed: Term labor symptoms and general obstetric precautions including but not limited to vaginal bleeding, contractions, leaking of fluid and fetal movement were reviewed in detail with the patient.  All questions were answered. Pt has home bp cuff. Check bp weekly, let us know if >140/90.  ? ?Follow-up: Return in about 1 week (around 09/28/2021) for LROB visit. ? ?No orders of the defined types were placed in this encounter. ? ? ?Myna Hidalgo, DO ?Attending Obstetrician & Gynecologist, Faculty Practice ?[facility] for [employer], [facility] ? ? ? ?

## 2021-09-28 ENCOUNTER — Ambulatory Visit (INDEPENDENT_AMBULATORY_CARE_PROVIDER_SITE_OTHER): Payer: BC Managed Care – PPO | Admitting: Advanced Practice Midwife

## 2021-09-28 ENCOUNTER — Encounter: Payer: Self-pay | Admitting: Advanced Practice Midwife

## 2021-09-28 ENCOUNTER — Other Ambulatory Visit: Payer: Self-pay

## 2021-09-28 VITALS — BP 116/80 | HR 80 | Wt 224.0 lb

## 2021-09-28 DIAGNOSIS — Z3A38 38 weeks gestation of pregnancy: Secondary | ICD-10-CM

## 2021-09-28 DIAGNOSIS — Z3480 Encounter for supervision of other normal pregnancy, unspecified trimester: Secondary | ICD-10-CM

## 2021-09-28 NOTE — Progress Notes (Signed)
? ?  LOW-RISK PREGNANCY VISIT ?Patient name: Layken Beg MRN [id number]  Date of birth: 07-28-1985 ?Chief Complaint:   ?Routine Prenatal Visit ? ?History of Present Illness:   ?Chany Woolworth is a 36 y.o. G48P1001 female at [redacted]w[redacted]d with an Estimated Date of Delivery: 10/10/21 being seen today for ongoing management of a low-risk pregnancy.  ?Today she reports  being unsure last night if the baby had moved enough, but he has been moving since then . Contractions: Not present. Vag. Bleeding: None.  Movement: Present. denies leaking of fluid. ?Review of Systems:   ?Pertinent items are noted in HPI ?Denies abnormal vaginal discharge w/ itching/odor/irritation, headaches, visual changes, shortness of breath, chest pain, abdominal pain, severe nausea/vomiting, or problems with urination or bowel movements unless otherwise stated above. ?Pertinent History Reviewed:  ?Reviewed past medical,surgical, social, obstetrical and family history.  ?Reviewed problem list, medications and allergies. ?Physical Assessment:  ? ?Vitals:  ? 09/28/21 0957  ?BP: 116/80  ?Pulse: 80  ?Weight: 224 lb (101.6 kg)  ?Body mass index is 38.45 kg/m?. ?  ?     Physical Examination:  ? General appearance: Well appearing, and in no distress ? Mental status: Alert, oriented to person, place, and time ? Skin: Warm & dry ? Cardiovascular: Normal heart rate noted ? Respiratory: Normal respiratory effort, no distress ? Abdomen: Soft, gravid, nontender ? Pelvic: Cervical exam performed  Dilation: 1.5 Effacement (%): 50 Station: -2 ? Extremities: Edema: Trace ? ?Fetal Status: Fetal Heart Rate (bpm): 153 Fundal Height: 38 cm Movement: Present Presentation: Vertex ? ?No results found for this or any previous visit (from the past 24 hour(s)).  ?Assessment & Plan:  ?1) Low-risk pregnancy G2P1001 at [redacted]w[redacted]d with an Estimated Date of Delivery: 10/10/21  ? ?2) Possibly decreased FM yesterday, rev'd FKCs; has been moving since last night ? ?3) Elective IOL, set up  by [facility] for 10/10/21 per pt ?  ?Meds: No orders of the defined types were placed in this encounter. ? ?Labs/procedures today: SVE ? ?Plan:  Continue routine obstetrical care  ? ?Reviewed: Term labor symptoms and general obstetric precautions including but not limited to vaginal bleeding, contractions, leaking of fluid and fetal movement were reviewed in detail with the patient.  All questions were answered. Has home bp cuff. Check bp weekly, let us know if >140/90.  ? ?Follow-up: Return for weekly as scheduled. ? ?No orders of the defined types were placed in this encounter. ? ?Arabella Merles CNM ?09/28/2021 ?10:27 AM  ?

## 2021-09-28 NOTE — Patient Instructions (Signed)
Try the [email or web address] positions to help with labor ?

## 2021-09-29 ENCOUNTER — Encounter (HOSPITAL_COMMUNITY): Payer: Self-pay | Admitting: Obstetrics and Gynecology

## 2021-09-29 ENCOUNTER — Inpatient Hospital Stay (EMERGENCY_DEPARTMENT_HOSPITAL)
Admission: AD | Admit: 2021-09-29 | Discharge: 2021-09-29 | Disposition: A | Discharge status: Home / Self Care | Payer: BC Managed Care – PPO | Source: Home / Self Care | Attending: Obstetrics and Gynecology | Admitting: Obstetrics and Gynecology

## 2021-09-29 DIAGNOSIS — O471 False labor at or after 37 completed weeks of gestation: Secondary | ICD-10-CM | POA: Insufficient documentation

## 2021-09-29 DIAGNOSIS — Z3480 Encounter for supervision of other normal pregnancy, unspecified trimester: Secondary | ICD-10-CM

## 2021-09-29 DIAGNOSIS — O479 False labor, unspecified: Secondary | ICD-10-CM

## 2021-09-29 DIAGNOSIS — F419 Anxiety disorder, unspecified: Secondary | ICD-10-CM

## 2021-09-29 DIAGNOSIS — Z3A38 38 weeks gestation of pregnancy: Secondary | ICD-10-CM | POA: Insufficient documentation

## 2021-09-29 MED ORDER — MORPHINE SULFATE (PF) 4 MG/ML IV SOLN
2.0000 mg | Freq: Once | INTRAVENOUS | Status: AC
  Administered 2021-09-29: 2 mg via INTRAMUSCULAR
  Filled 2021-09-29: qty 1

## 2021-09-29 NOTE — Discharge Instructions (Signed)
Reasons to return to MAU at [facility]:  1.  Contractions are  5 minutes apart or less, each last 1 minute, these have been going on for 1-2 hours, and you cannot walk or talk during them 2.  You have a large gush of fluid, or a trickle of fluid that will not stop and you have to wear a pad 3.  You have bleeding that is bright red, heavier than spotting--like menstrual bleeding (spotting can be normal in early labor or after a check of your cervix) 4.  You do not feel the baby moving like he/she normally does  

## 2021-09-29 NOTE — MAU Provider Note (Signed)
Ms. Lori Donaldson is a G2P1001 at [redacted]w[redacted]d seen in MAU for labor.  ? ?RN labor check, not seen by provider. SVE by RN Dilation: 2 ?Effacement (%): 70 ?Station: -2 ?Presentation: Vertex ?Exam by:: Georgina Snell, RN  ? ?NST - FHR: 125 bpm / moderate variability / accels present / decels absent ?NST reactive / TOCO: regular every 2-3 mins  ? ?Plan: ? ?D/C home with labor precautions ?Keep scheduled appt at [facility] ?Return to MAU with signs of labor or emergencies ? ?Sharen Counter, CNM  ?09/29/2021 7:07 AM  ? ?

## 2021-09-29 NOTE — MAU Note (Signed)
I have communicated with Juliann Mule, MD, Evalina Field, MD, and Sharen Counter, CNM and reviewed vital signs:  ?Vitals:  ? 09/29/21 0415 09/29/21 0435  ?BP: (!) 129/91 124/78  ?Pulse: 89 80  ?Resp: 18   ?Temp: 98.1 ?F (36.7 ?C)   ?SpO2: 98%   ?  ?Vaginal exam:  Dilation: 2 ?Effacement (%): 70 ?Station: -2 ?Presentation: Vertex ?Exam by:: Georgina Snell, RN,  ? ?Also reviewed contraction pattern and that non-stress test is reactive.  It has been documented that patient is contracting every 3-5 minutes with no cervical change over 3 hours not indicating active labor.  Patient denies any other complaints.  Based on this report provider has given order for discharge.  A discharge order and diagnosis entered by a provider.   ?   ? ? ? ?  ?

## 2021-09-29 NOTE — MAU Note (Signed)
.  Lori Donaldson is a 36 y.o. at [redacted]w[redacted]d here in MAU reporting: contractions that started around 0200 coming every 5-10 minutes.  Pt reports bloody show when she wipes and clear discharge.  +FM ? ?LMP: 12/27/20  ?Onset of complaint: 0200 ?Pain score: 8 ?Vitals:  ? 09/29/21 0415  ?BP: (!) 129/91  ?Pulse: 89  ?Resp: 18  ?Temp: 98.1 ?F (36.7 ?C)  ?SpO2: 98%  ?   ?FHT:145 ?Lab orders placed from triage:   labor evaluation order  ?

## 2021-09-30 ENCOUNTER — Encounter (HOSPITAL_COMMUNITY): Payer: Self-pay | Admitting: Obstetrics and Gynecology

## 2021-09-30 ENCOUNTER — Inpatient Hospital Stay (HOSPITAL_COMMUNITY): Payer: BC Managed Care – PPO | Admitting: Anesthesiology

## 2021-09-30 ENCOUNTER — Telehealth: Payer: Self-pay | Admitting: *Deleted

## 2021-09-30 ENCOUNTER — Inpatient Hospital Stay (HOSPITAL_COMMUNITY)
Admission: AD | Admit: 2021-09-30 | Discharge: 2021-10-02 | DRG: 807 | Disposition: A | Discharge status: Home / Self Care | Payer: BC Managed Care – PPO | Attending: Obstetrics and Gynecology | Admitting: Obstetrics and Gynecology

## 2021-09-30 ENCOUNTER — Ambulatory Visit (INDEPENDENT_AMBULATORY_CARE_PROVIDER_SITE_OTHER): Payer: BC Managed Care – PPO | Admitting: Advanced Practice Midwife

## 2021-09-30 ENCOUNTER — Other Ambulatory Visit: Payer: Self-pay

## 2021-09-30 ENCOUNTER — Encounter: Payer: Self-pay | Admitting: Advanced Practice Midwife

## 2021-09-30 VITALS — BP 118/85 | HR 95 | Wt 219.0 lb

## 2021-09-30 DIAGNOSIS — Z87891 Personal history of nicotine dependence: Secondary | ICD-10-CM

## 2021-09-30 DIAGNOSIS — Z3A38 38 weeks gestation of pregnancy: Secondary | ICD-10-CM

## 2021-09-30 DIAGNOSIS — O26893 Other specified pregnancy related conditions, third trimester: Secondary | ICD-10-CM | POA: Diagnosis present

## 2021-09-30 DIAGNOSIS — F419 Anxiety disorder, unspecified: Secondary | ICD-10-CM

## 2021-09-30 DIAGNOSIS — Z3480 Encounter for supervision of other normal pregnancy, unspecified trimester: Secondary | ICD-10-CM

## 2021-09-30 DIAGNOSIS — Z3490 Encounter for supervision of normal pregnancy, unspecified, unspecified trimester: Secondary | ICD-10-CM

## 2021-09-30 LAB — COMPREHENSIVE METABOLIC PANEL
ALT: 16 U/L (ref 0–44)
AST: 13 U/L — ABNORMAL LOW (ref 15–41)
Albumin: 2.4 g/dL — ABNORMAL LOW (ref 3.5–5.0)
Alkaline Phosphatase: 170 U/L — ABNORMAL HIGH (ref 38–126)
Anion gap: 10 (ref 5–15)
BUN: 8 mg/dL (ref 6–20)
CO2: 21 mmol/L — ABNORMAL LOW (ref 22–32)
Calcium: 8.8 mg/dL — ABNORMAL LOW (ref 8.9–10.3)
Chloride: 107 mmol/L (ref 98–111)
Creatinine, Ser: 0.64 mg/dL (ref 0.44–1.00)
GFR, Estimated: >60 mL/min (ref >60)
Glucose, Bld: 83 mg/dL (ref 70–99)
Potassium: 3.9 mmol/L (ref 3.5–5.1)
Sodium: 138 mmol/L (ref 135–145)
Total Bilirubin: 0.4 mg/dL (ref 0.3–1.2)
Total Protein: 6.1 g/dL — ABNORMAL LOW (ref 6.5–8.1)

## 2021-09-30 LAB — PROTEIN / CREATININE RATIO, URINE
Creatinine, Urine: 195.41 mg/dL
Protein Creatinine Ratio: 0.12 mg/mg{Cre} (ref 0.00–0.15)
Total Protein, Urine: 23 mg/dL

## 2021-09-30 LAB — CBC
HCT: 37.2 % (ref 36.0–46.0)
Hemoglobin: 12.4 g/dL (ref 12.0–15.0)
MCH: 29.6 pg (ref 26.0–34.0)
MCHC: 33.3 g/dL (ref 30.0–36.0)
MCV: 88.8 fL (ref 80.0–100.0)
Platelets: 321 10*3/uL (ref 150–400)
RBC: 4.19 MIL/uL (ref 3.87–5.11)
RDW: 13.2 % (ref 11.5–15.5)
WBC: 12 10*3/uL — ABNORMAL HIGH (ref 4.0–10.5)
nRBC: 0 % (ref 0.0–0.2)

## 2021-09-30 LAB — TYPE AND SCREEN
ABO/RH(D): B POS
Antibody Screen: NEGATIVE

## 2021-09-30 LAB — RPR: RPR Ser Ql: NONREACTIVE

## 2021-09-30 MED ORDER — SOD CITRATE-CITRIC ACID 500-334 MG/5ML PO SOLN: 30.0000 mL | ORAL | Status: DC | PRN

## 2021-09-30 MED ORDER — FENTANYL-BUPIVACAINE-NACL 0.5-0.125-0.9 MG/250ML-% EP SOLN
12.0000 mL/h | EPIDURAL | Status: DC | PRN
  Administered 2021-09-30: 12 mL/h via EPIDURAL
  Filled 2021-09-30: qty 250

## 2021-09-30 MED ORDER — EPHEDRINE 5 MG/ML INJ: 10.0000 mg | INTRAVENOUS | Status: DC | PRN

## 2021-09-30 MED ORDER — OXYCODONE-ACETAMINOPHEN 5-325 MG PO TABS: 2.0000 | ORAL_TABLET | ORAL | Status: DC | PRN

## 2021-09-30 MED ORDER — PHENYLEPHRINE 40 MCG/ML (10ML) SYRINGE FOR IV PUSH (FOR BLOOD PRESSURE SUPPORT): 80.0000 ug | PREFILLED_SYRINGE | INTRAVENOUS | Status: DC | PRN

## 2021-09-30 MED ORDER — LIDOCAINE HCL (PF) 1 % IJ SOLN
INTRAMUSCULAR | Status: DC | PRN
Start: 2021-09-30 — End: 2021-09-30
  Administered 2021-09-30 (×2): 5 mL via EPIDURAL

## 2021-09-30 MED ORDER — ACETAMINOPHEN 325 MG PO TABS: 650.0000 mg | ORAL_TABLET | ORAL | Status: DC | PRN

## 2021-09-30 MED ORDER — LACTATED RINGERS IV SOLN: 500.0000 mL | INTRAVENOUS | Status: DC | PRN

## 2021-09-30 MED ORDER — DIPHENHYDRAMINE HCL 50 MG/ML IJ SOLN: 12.5000 mg | INTRAMUSCULAR | Status: DC | PRN

## 2021-09-30 MED ORDER — LACTATED RINGERS IV SOLN: 500.0000 mL | Freq: Once | INTRAVENOUS | Status: DC

## 2021-09-30 MED ORDER — OXYCODONE-ACETAMINOPHEN 5-325 MG PO TABS: 1.0000 | ORAL_TABLET | ORAL | Status: DC | PRN

## 2021-09-30 MED ORDER — OXYTOCIN BOLUS FROM INFUSION
333.0000 mL | Freq: Once | INTRAVENOUS | Status: AC
  Administered 2021-09-30: 333 mL via INTRAVENOUS

## 2021-09-30 MED ORDER — LIDOCAINE HCL (PF) 1 % IJ SOLN: 30.0000 mL | INTRAMUSCULAR | Status: DC | PRN

## 2021-09-30 MED ORDER — ONDANSETRON HCL 4 MG/2ML IJ SOLN
4.0000 mg | Freq: Four times a day (QID) | INTRAMUSCULAR | Status: DC | PRN
  Administered 2021-09-30: 4 mg via INTRAVENOUS
  Filled 2021-09-30: qty 2

## 2021-09-30 MED ORDER — OXYTOCIN-SODIUM CHLORIDE 30-0.9 UT/500ML-% IV SOLN
2.5000 [IU]/h | INTRAVENOUS | Status: DC
  Administered 2021-09-30: 2.5 [IU]/h via INTRAVENOUS
  Filled 2021-09-30: qty 500

## 2021-09-30 MED ORDER — LACTATED RINGERS IV SOLN: INTRAVENOUS | Status: DC

## 2021-09-30 NOTE — Progress Notes (Signed)
Patient ID: Lori Donaldson, female   DOB: December 12, 1985, 36 y.o.   MRN: [id number] ? ?In to see pt; just began feeling a lot of pressure and started pushing w last couple of ctx ? ?BP 121/76, P100 ?FHR 140s, +accels, +LTV, early variables ?Ctx q 2 mins, spont ?Cx C/C/vtx 0 ? ?IUP@38 .4wks ?End 1st stage ? ?Begin pushing w ctx ?Anticipate vag del ? ?Arabella Merles CNM ?09/30/2021 ?9:17 PM ? ?

## 2021-09-30 NOTE — Telephone Encounter (Signed)
Patient states she went to MAU last night since she was having contractions every 5-10 minutes and was having some bloody show.  She was sent home due to cervix being unchanged . Throughout the night she continued to have contractions every 7-10 minutes and since 1am, they have been between 5-10 minutes, with some every 4 minutes.  She has tried warm baths, etc but nothing seems to be helping. Denies any bleeding or leaking of fluid at this time .  Discussed that she is most likely still having false labor contractions and unless her cervix is changing, she is not in labor.  Advised she could come to our office for a cervical exam, however, if her cervix was still unchanged, she would be sent back home.  Patient stated she would like to come here for an exam verses going to MAU even though she is currently in [place]. Placed on schedule for this morning.  ?

## 2021-09-30 NOTE — Progress Notes (Incomplete)
LABOR PROGRESS NOTE ? ?Subjective: Patient reports increasing discomfort and pressure with CTXs.  ? ?Objective: ?Blood pressure 121/79, pulse 92, temperature 98.3 ?F (36.8 ?C), resp. rate 17, height 5\' 4"  (1.626 m), weight 99.1 kg, last menstrual period 12/27/2020, SpO2 99 %. ?Temp (24hrs), Avg:98.3 ?F (36.8 ?C), Min:98.3 ?F (36.8 ?C), Max:98.3 ?F (36.8 ?C) ? ? ?Fetal Heart Tones ?Baseline: 145 ?Variability: moderate ?Accelerations: present; 10x10 ?Decelerations: present; early, variables ?Overall assessment: cat 2 ? ?Contractions: q 2-4 ? ?Cervical Exam: 9/90%/-1  ? ?ASSESSMENT AND PLAN:   ? ?Electronically Signed By: ?Greggory Keen, SNM ?09/30/21 7:31 PM   ?

## 2021-09-30 NOTE — Progress Notes (Addendum)
LABOR PROGRESS NOTE ? ?Subjective: Patient is comfortable at bs following epidural placement. Does not report increase in pressure with CTXs. ? ?Objective: ?Blood pressure (!) 113/53, pulse (!) 103, temperature 98.3 ?F (36.8 ?C), resp. rate 17, height 5\' 4"  (1.626 m), weight 99.1 kg, last menstrual period 12/27/2020, SpO2 99 %. ?Temp (24hrs), Avg:98.3 ?F (36.8 ?C), Min:98.3 ?F (36.8 ?C), Max:98.3 ?F (36.8 ?C) ? ? ?Fetal Heart Tones ?Baseline: 140 ?Variability: moderate ?Accelerations: present; 15x15 ?Decelerations: absent ?Overall assessment: cat 1 ? ?Contractions: q4-5 min ? ?Cervical Exam: 7.5-8/90%/-1; AROM, clear  ? ?ASSESSMENT AND PLAN:   ?Normal IUP at [redacted]w[redacted]d in active labor ?Counseled on risks/benefits of AROM. Pt verbalized understanding and agreed to same ?Continue current management, discussed pitocin augmentation if needed ? ?Anticipate SVD ? ?Electronically Signed By: ?Greggory Keen, SNM ?09/30/21 4:35 PM   ? ?Attestation of CNM Supervision of midwife student: Evaluation and management procedures were performed by the midwife student under my supervision. I was immediately available for direct supervision, assistance and direction throughout this encounter.  I also confirm that I have verified the information documented in the resident?s note, and that I have also personally reperformed the pertinent components of the physical exam and all of the medical decision making activities.  I have also made any necessary editorial changes. ? ? ?Renee Harder, CNM ?09/30/2021 4:43 PM ? ? ?

## 2021-09-30 NOTE — Progress Notes (Signed)
? ?  LOW-RISK PREGNANCY VISIT- work in for labor evaluation ?Patient name: Lori Donaldson MRN [id number]  Date of birth: [birth year] ?Chief Complaint:   ?Routine Prenatal Visit (Contractions started early yesterday morning; went to [facility] and was sent home) ? ?History of Present Illness:   ?Lori Donaldson is a 36 y.o. G75P1001 female at [redacted]w[redacted]d with an Estimated Date of Delivery: 10/10/21 being seen today for ongoing management of a low-risk pregnancy.  ?Today she reports  irreg ctx x 24h every 4-12 mins, getting stronger this morning . Contractions: Regular. Vag. Bleeding: Bloody Show.  Movement: Present. denies leaking of fluid. ?Review of Systems:   ?Pertinent items are noted in HPI ?Denies abnormal vaginal discharge w/ itching/odor/irritation, headaches, visual changes, shortness of breath, chest pain, abdominal pain, severe nausea/vomiting, or problems with urination or bowel movements unless otherwise stated above. ?Pertinent History Reviewed:  ?Reviewed past medical,surgical, social, obstetrical and family history.  ?Reviewed problem list, medications and allergies. ?Physical Assessment:  ? ?Vitals:  ? 09/30/21 0953  ?BP: 118/85  ?Pulse: 95  ?Weight: 219 lb (99.3 kg)  ?Body mass index is 37.59 kg/m?. ?  ?     Physical Examination:  ? General appearance: Well appearing, and in no distress ? Mental status: Alert, oriented to person, place, and time ? Skin: Warm & dry ? Cardiovascular: Normal heart rate noted ? Respiratory: Normal respiratory effort, no distress ? Abdomen: Soft, gravid, nontender ? Pelvic: Cervical exam performed  Dilation: 4.5 Effacement (%): 90 Station: -2 ? Extremities: Edema: None ? ?Fetal Status: Fetal Heart Rate (bpm): 136   Movement: Present Presentation: Vertex ? ?No results found for this or any previous visit (from the past 24 hour(s)).  ?Assessment & Plan:  ?1) Low-risk pregnancy G2P1001 at [redacted]w[redacted]d with an Estimated Date of Delivery: 10/10/21  ? ?2) Latent labor, direct admit to L&D; day  team and L&D charge notified ?  ?Meds: No orders of the defined types were placed in this encounter. ? ?Labs/procedures today: SVE ? ?Plan:  To L&D ? ?Follow-up: Return for cancel OB appts; make 6 week PP appt. ? ?No orders of the defined types were placed in this encounter. ? ?Arabella Merles CNM ?09/30/2021 ?10:09 AM  ?

## 2021-09-30 NOTE — H&P (Addendum)
OBSTETRIC ADMISSION HISTORY AND PHYSICAL ? ?Lori Donaldson is a 36 y.o. female G2P1001 with IUP at [redacted]w[redacted]d by ultrasound presenting for SOL (was dilated to 4.5 today from 2 yesterday). She reports +FMs, No LOF, no VB, no blurry vision, headaches or peripheral edema, and RUQ pain.  She plans on breast and bottle feeding. She requests POPs for birth control. ?She received her prenatal care at [facility]  ? ?Dating: By ultrasound --->  Estimated Date of Delivery: 10/10/21 ? ?Sono:   ? ?@[redacted]w[redacted]d , CWD, normal anatomy, cephalic presentation,  340g, EFW ? ? ?Prenatal History/Complications:  ?--Anxiety (no meds) ? ?Past Medical History: ?Past Medical History:  ?Diagnosis Date  ? Anxiety   ? has therapist  ? Asthma   ? when young  ? Cholecystitis 2016  ? Cholelithiases 2016  ? Hx of cholecystectomy 03/16/2015  ? Ovarian cyst rupture 2018  ? Plantar fasciitis   ? Vaginal Pap smear, abnormal   ? 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 ? ?Past Surgical History: ?Past Surgical History:  ?Procedure Laterality Date  ? CERVICAL BIOPSY  W/ LOOP ELECTRODE EXCISION    ? CHOLECYSTECTOMY N/A 03/16/2015  ? Procedure: LAPAROSCOPIC CONVERTED TO OPEN CHOLECYSTECTOMY;  Surgeon: 05/16/2015, MD;  Location: [facility];  Service: General;  Laterality: N/A;  ? ? ?Obstetrical History: ?OB History   ? ? Gravida  ?2  ? Para  ?1  ? Term  ?1  ? Preterm  ?   ? AB  ?   ? Living  ?1  ?  ? ? SAB  ?   ? IAB  ?   ? Ectopic  ?   ? Multiple  ?0  ? Live Births  ?1  ?   ?  ?  ? ? ?Social History ?Social History  ? ?Socioeconomic History  ? Marital status: Married  ?  Spouse name: Not on file  ? Number of children: Not on file  ? Years of education: Not on file  ? Highest education level: Not on file  ?Occupational History  ? Not on file  ?Tobacco Use  ? Smoking status: Former  ?  Types: Cigarettes  ? Smokeless tobacco: Never  ? Tobacco comments:  ?  rare, on occasion social  ?Vaping Use  ? Vaping Use: Never used  ?Substance and Sexual Activity  ? Alcohol use:  Not Currently  ? Drug use: No  ? Sexual activity: Yes  ?  Birth control/protection: None  ?Other Topics Concern  ? Not on file  ?Social History Narrative  ? Not on file  ? ?Social Determinants of Health  ? ?Financial Resource Strain: Low Risk   ? Difficulty of Paying Living Expenses: Not hard at all  ?Food Insecurity: No Food Insecurity  ? Worried About Claud Kelp in the Last Year: Never true  ? Ran Out of Food in the Last Year: Never true  ?Transportation Needs: No Transportation Needs  ? Lack of Transportation (Medical): No  ? Lack of Transportation (Non-Medical): No  ?Physical Activity: Sufficiently Active  ? Days of Exercise per Week: 3 days  ? Minutes of Exercise per Session: 60 min  ?Stress: No Stress Concern Present  ? Feeling of Stress : Not at all  ?Social Connections: Moderately Isolated  ? Frequency of Communication with Friends and Family: Twice a week  ? Frequency of Social Gatherings with Friends and Family: Once a week  ? Attends Religious Services: Never  ? Active Member  of Clubs or Organizations: No  ? Attends BankerClub or Organization Meetings: Never  ? Marital Status: Married  ? ? ?Family History: ?Family History  ?Problem Relation Age of Onset  ? Diabetes Maternal Grandmother   ? Arthritis Maternal Grandmother   ? Heart disease Maternal Grandmother   ? Cataracts Maternal Grandmother   ? Multiple sclerosis Maternal Grandfather   ? Hyperlipidemia Father   ? Diabetes Maternal Uncle   ? ? ?Allergies: ?No Known Allergies ? ?Pt denies allergies to latex, iodine, or shellfish. ? ?Medications Prior to Admission  ?Medication Sig Dispense Refill Last Dose  ? Acetaminophen (TYLENOL PO) Take by mouth.     ? guaiFENesin (MUCINEX PO) Take by mouth.     ? Prenatal Vit-Fe Fumarate-FA (PRENATAL MULTIVITAMIN) TABS tablet Take 1 tablet by mouth daily at 12 noon.     ? ? ? ?Review of Systems  ? ?All systems reviewed and negative except as stated in HPI ? ?Blood pressure (!) 142/87, pulse 82, height 5\' 4"  (1.626  m), weight 99.1 kg, last menstrual period 12/27/2020. ?General appearance: alert, cooperative, and moderate distress ?Lungs: clear to auscultation bilaterally ?Heart: regular rate and rhythm ?Abdomen: soft, non-tender; bowel sounds normal ?Extremities: Homans sign is negative, no sign of DVT ?Presentation: cephalic ?Fetal monitoringBaseline: 140 bpm, Variability: Good {> 6 bpm), Accelerations: Reactive, and Decelerations: Absent ?Uterine activity 2-4 minutes ?  ?Dilation 6/Effacement 90%/Station -1 by RN exam ? ?Prenatal labs: ?ABO, Rh: --/--/PENDING (03/24 1245) ?Antibody: PENDING (03/24 1245) ?Rubella: 1.02 (10/12 1513) ?RPR: Non Reactive (01/13 0853)  ?HBsAg: Negative (10/12 1513)  ?HIV: Non Reactive (01/13 0853)  ?GBS: Negative/-- (03/08 1430)  ?2 hr Glucola: 123 ?Genetic screening:  Tetra screen normal ?Anatomy US: Normal female ? ?Prenatal Transfer Tool  ?Maternal Diabetes: No ?Genetic Screening: Normal ?Maternal Ultrasounds/Referrals: Normal ?Fetal Ultrasounds or other Referrals:  None ?Maternal Substance Abuse:  No ?Significant Maternal Medications:  None ?Significant Maternal Lab Results: Group B Strep negative ? ?Results for orders placed or performed during the hospital encounter of 09/30/21 (from the past 24 hour(s))  ?CBC  ? Collection Time: 09/30/21 12:45 PM  ?Result Value Ref Range  ? WBC 12.0 (H) 4.0 - 10.5 K/uL  ? RBC 4.19 3.87 - 5.11 MIL/uL  ? Hemoglobin 12.4 12.0 - 15.0 g/dL  ? HCT 37.2 36.0 - 46.0 %  ? MCV 88.8 80.0 - 100.0 fL  ? MCH 29.6 26.0 - 34.0 pg  ? MCHC 33.3 30.0 - 36.0 g/dL  ? RDW 13.2 11.5 - 15.5 %  ? Platelets 321 150 - 400 K/uL  ? nRBC 0.0 0.0 - 0.2 %  ?Type and screen [facility]  ? Collection Time: 09/30/21 12:45 PM  ?Result Value Ref Range  ? ABO/RH(D) PENDING   ? Antibody Screen PENDING   ? Sample Expiration    ?  10/03/2021,2359 ?Performed at [facility], [address] [place], [place] [address] ?  ? ? ?Patient Active Problem List  ? Diagnosis Date Noted   ? Pregnancy 09/30/2021  ? Encounter for supervision of normal pregnancy, antepartum 04/20/2021  ? Anxiety 04/20/2021  ? Cholecystitis, acute with cholelithiasis 03/16/2015  ? ? ?Assessment/Plan:  ?Annamaria BootsJessica Marie Sirois is a 36 y.o. G2P1001 at 572w4d here for SOL  ? ?#Labor:Expectantly managing currently, consider AROM at next check ?#Pain: Plan for epidural ?#FWB: Cat I ?#ID:  GBS negative ?#MOF: Breast and bottle ?#MOC: POPs ?#Circ:  Yes ? ?Levin ErpMayuri Jagadish, MD  ?[facility] ?09/30/2021, 1:16 PM ? ? ?  ?Attestation of CNM Supervision of Resident: Evaluation and management procedures were performed by the [facility] Medicine Resident under my supervision. I was immediately available for direct supervision, assistance and direction throughout this encounter.  I also confirm that I have verified the information documented in the resident?s note, and that I have also personally reperformed the pertinent components of the physical exam and all of the medical decision making activities.  I have also made any necessary editorial changes. ? ?Brand Males, CNM ?09/30/2021 1:44 PM ? ? ?

## 2021-09-30 NOTE — Anesthesia Procedure Notes (Signed)
Epidural ?Patient location during procedure: OB ?Start time: 09/30/2021 2:16 PM ?End time: 09/30/2021 2:24 PM ? ?Staffing ?Anesthesiologist: Josephine Igo, MD ?Performed: anesthesiologist  ? ?Preanesthetic Checklist ?Completed: patient identified, IV checked, site marked, risks and benefits discussed, surgical consent, monitors and equipment checked, pre-op evaluation and timeout performed ? ?Epidural ?Patient position: sitting ?Prep: DuraPrep and site prepped and draped ?Patient monitoring: continuous pulse ox and blood pressure ?Approach: midline ?Injection technique: LOR saline ? ?Needle:  ?Needle type: Tuohy  ?Needle gauge: 17 G ?Needle length: 9 cm and 9 ?Needle insertion depth: 6 cm ?Catheter type: closed end flexible ?Catheter size: 19 Gauge ?Catheter at skin depth: 11 cm ?Test dose: negative and Other ? ?Assessment ?Events: blood not aspirated, injection not painful, no injection resistance, no paresthesia and negative IV test ? ?Additional Notes ?Patient identified. Risks and benefits discussed including failed block, incomplete  ?Pain control, post dural puncture headache, nerve damage, paralysis, blood pressure ?Changes, nausea, vomiting, reactions to medications-both toxic and allergic and post ?Partum back pain. All questions were answered. Patient expressed understanding and wished to proceed. Sterile technique was used throughout procedure. Epidural site was ?Dressed with sterile barrier dressing. No paresthesias, signs of intravascular injection ?Or signs of intrathecal spread were encountered.  ?Patient was more comfortable after the epidural was dosed. ?Please see RN's note for documentation of vital signs and FHR which are stable. ?Reason for block:procedure for pain ? ? ? ?

## 2021-09-30 NOTE — Discharge Summary (Addendum)
?  Postpartum Discharge Summary ? ?  ?Patient Name: Lori Donaldson ?DOB: [birth year] ?MRN: [id number] ? ?Date of admission: 09/30/2021 ?Delivery date:09/30/2021  ?Delivering provider: Serita Grammes D  ?Date of discharge: 10/02/2021 ? ?Admitting diagnosis: Pregnancy [Z34.90] ?Intrauterine pregnancy: [redacted]w[redacted]d    ?Secondary diagnosis:  Principal Problem: ?  Vaginal delivery ?Active Problems: ?  Pregnancy ? ?Additional problems: none    ?Discharge diagnosis: Term Pregnancy Delivered                                              ?Post partum procedures: None ?Augmentation: AROM ?Complications: None ? ?Hospital course: Onset of Labor With Vaginal Delivery      ?36y.o. yo G2P1001 at 334w4das [place]. Patient had an uncomplicated labor course as follows:  ? ?Patient was admitted at 6 cm in early active labor, AROM was performed and patient pushed for around 45 minutes with SVD.  ? ?Membrane Rupture Time/Date: 4:31 PM ,09/30/2021   ?Delivery Method:Vaginal, Spontaneous  ?Episiotomy: None  ?Lacerations:  None  ?Patient had an uncomplicated postpartum course.  She is ambulating, tolerating a regular diet, passing flatus, and urinating well. Patient is discharged home in stable condition on 10/02/21. ? ?Newborn Data: ?Birth date:09/30/2021  ?Birth time:9:59 PM  ?Gender:Female  ?Living status:Living  ?Apgars:6 ,9  ?Weight:2980 g (6lb 9.1oz) ? ?Magnesium Sulfate received: No ?BMZ received: No ?Rhophylac:N/A ?MMR:N/A ?T-DaP:Given prenatally ?Flu: No ?Transfusion:No ? ?Physical exam  ?Vitals:  ? 10/01/21 0950 10/01/21 1424 10/01/21 2220 10/02/21 0538  ?BP: 114/68 112/70 113/80 107/76  ?Pulse: 85 69 79 73  ?Resp: _0 ?Temp: 98.2 ?F (36.8 ?C) 98.2 ?F (36.8 ?C) 98.2 ?F (36.8 ?C) 97.8 ?F (36.6 ?C)  ?TempSrc: Oral Oral Oral Oral  ?SpO2: 97% 97% 97%   ?Weight:      ?Height:      ? ?General: alert, cooperative, and no distress ?Lochia: appropriate ?Uterine Fundus: firm ?Incision: N/A ?DVT Evaluation: No  evidence of DVT seen on physical exam. ?No significant calf/ankle edema. ?Labs: ?Lab Results  ?Component Value Date  ? WBC 12.0 (H) 09/30/2021  ? HGB 12.4 09/30/2021  ? HCT 37.2 09/30/2021  ? MCV 88.8 09/30/2021  ? PLT 321 09/30/2021  ? ? ?  Latest Ref Rng & Units 09/30/2021  ?  1:08 PM  ?CMP  ?Glucose 70 - 99 mg/dL 83    ?BUN 6 - 20 mg/dL 8    ?Creatinine 0.44 - 1.00 mg/dL 0.64    ?Sodium 135 - 145 mmol/L 138    ?Potassium 3.5 - 5.1 mmol/L 3.9    ?Chloride 98 - 111 mmol/L 107    ?CO2 22 - 32 mmol/L 21    ?Calcium 8.9 - 10.3 mg/dL 8.8    ?Total Protein 6.5 - 8.1 g/dL 6.1    ?Total Bilirubin 0.3 - 1.2 mg/dL 0.4    ?Alkaline Phos 38 - 126 U/L 170    ?AST 15 - 41 U/L 13    ?ALT 0 - 44 U/L 16    ? ?Edinburgh Score: ? ?  10/02/2021  ?  2:30 AM  ?EdFlavia Shipperostnatal Depression Scale Screening Tool  ?I have been able to laugh and see the funny side of things. 0  ?I have looked forward with enjoyment to things. 0  ?I have blamed myself unnecessarily when things  went wrong. 1  ?I have been anxious or worried for no good reason. 2  ?I have felt scared or panicky for no good reason. 1  ?Things have been getting on top of me. 1  ?I have been so unhappy that I have had difficulty sleeping. 0  ?I have felt sad or miserable. 1  ?I have been so unhappy that I have been crying. 0  ?The thought of harming myself has occurred to me. 0  ?Edinburgh Postnatal Depression Scale Total 6  ? ? ? ?After visit meds:  ?Allergies as of 10/02/2021   ?No Known Allergies ?  ? ?  ?Medication List  ?  ? ?TAKE these medications   ? ?acetaminophen 325 MG tablet ?Commonly known as: Tylenol ?Take 2 tablets (650 mg total) by mouth every 4 (four) hours as needed (for pain scale < 4). ?What changed:  ?medication strength ?how much to take ?when to take this ?reasons to take this ?  ?ibuprofen 600 MG tablet ?Commonly known as: ADVIL ?Take 1 tablet (600 mg total) by mouth every 6 (six) hours. ?  ?MUCINEX PO ?Take by mouth. ?  ?prenatal multivitamin Tabs  tablet ?Take 1 tablet by mouth daily at 12 noon. ?  ? ?  ? ? ? ?Discharge home in stable condition ?Infant Feeding: Bottle and Breast ?Infant Disposition:home with mother ?Discharge instruction: per After Visit Summary and Postpartum booklet. ?Activity: Advance as tolerated. Pelvic rest for 6 weeks.  ?Diet: routine diet ?Future Appointments: ?Future Appointments  ?Date Time Provider [place]  ?11/11/2021 10:10 AM Janyth Pupa, DO [facility]  ? ?Follow up Visit: ? ?Myrtis Ser, CNM  [facility] [place] ?Please schedule this patient for Postpartum visit in: 6 weeks with the following provider: Dr Nelda Marseille (already scheduled)  ?For C/S patients schedule nurse incision check in weeks 2 weeks: no  ?Low risk pregnancy complicated by: none  ?Delivery mode:  SVD  ?Anticipated Birth Control:  POPs  ?PP Procedures needed: none  ?Schedule Integrated BH visit: no ? ? ?10/02/2021 ?Gerrit Heck, MD ? ?Attestation of Supervision of Student:  I confirm that I have verified the information documented in the resident's note and that I have also personally reperformed the history, physical exam and all medical decision making activities.  I have verified that all services and findings are accurately documented in this student's note; and I agree with management and plan as outlined in the documentation. I have also made any necessary editorial changes. ? ?Exam: ?General: Appears Well, No Distress ?Psych:  Appropriate Mood ?Chest: Lungs CTA, HRRR ?Abdomen: Soft, NT, Fundus firm at U/-2 ?Lochia: Appropriate ?Extremities: No apparent edema ? ?Discussed postpartum precautions including blood pressures, headaches, fever, and bleeding.  Reviewed desires for oral contraception and will send medication to pharmacy on file.  Patient declines pain medication.  Patient informed that MD will come to bedside to discuss circumcision as appropriate.   ? ?Maryann Conners, CNM ?Center for [place] ?10/02/2021 9:01 AM ? ? ?

## 2021-09-30 NOTE — Anesthesia Preprocedure Evaluation (Signed)
Anesthesia Evaluation  ?Patient identified by MRN, date of birth, ID band ?Patient awake ? ? ? ?Reviewed: ?Allergy & Precautions, Patient's Chart, lab work & pertinent test results ? ?Airway ?Mallampati: II ? ?TM Distance: >3 FB ?Neck ROM: Full ? ? ? Dental ?no notable dental hx. ?(+) Teeth Intact ?  ?Pulmonary ?asthma , former smoker,  ?  ?Pulmonary exam normal ?breath sounds clear to auscultation ? ? ? ? ? ? Cardiovascular ?negative cardio ROS ?Normal cardiovascular exam ?Rhythm:Regular Rate:Normal ? ? ?  ?Neuro/Psych ?Anxiety   ? GI/Hepatic ?Neg liver ROS, GERD  ,  ?Endo/Other  ?Obesity ? Renal/GU ?negative Renal ROS  ?negative genitourinary ?  ?Musculoskeletal ?negative musculoskeletal ROS ?(+)  ? Abdominal ?(+) + obese,   ?Peds ? Hematology ?negative hematology ROS ?(+)   ?Anesthesia Other Findings ? ? Reproductive/Obstetrics ?(+) Pregnancy ? ?  ? ? ? ? ? ? ? ? ? ? ? ? ? ?  ?  ? ? ? ? ? ? ? ? ?Anesthesia Physical ?Anesthesia Plan ? ?ASA: 2 ? ?Anesthesia Plan: Epidural  ? ?Post-op Pain Management:   ? ?Induction:  ? ?PONV Risk Score and Plan:  ? ?Airway Management Planned: Natural Airway ? ?Additional Equipment:  ? ?Intra-op Plan:  ? ?Post-operative Plan:  ? ?Informed Consent: I have reviewed the patients History and Physical, chart, labs and discussed the procedure including the risks, benefits and alternatives for the proposed anesthesia with the patient or authorized representative who has indicated his/her understanding and acceptance.  ? ? ? ? ? ?Plan Discussed with: Anesthesiologist ? ?Anesthesia Plan Comments:   ? ? ? ? ? ? ?Anesthesia Quick Evaluation ? ?

## 2021-10-01 LAB — RPR: RPR Ser Ql: NONREACTIVE

## 2021-10-01 MED ORDER — SENNOSIDES-DOCUSATE SODIUM 8.6-50 MG PO TABS: 2.0000 | ORAL_TABLET | ORAL | Status: DC

## 2021-10-01 MED ORDER — WITCH HAZEL-GLYCERIN EX PADS: 1.0000 "application " | MEDICATED_PAD | CUTANEOUS | Status: DC | PRN

## 2021-10-01 MED ORDER — BENZOCAINE-MENTHOL 20-0.5 % EX AERO
1.0000 | INHALATION_SPRAY | CUTANEOUS | Status: DC | PRN
Start: 2021-10-01 — End: 2021-10-02
  Administered 2021-10-01: 1 via TOPICAL
  Filled 2021-10-01: qty 56

## 2021-10-01 MED ORDER — ONDANSETRON HCL 4 MG PO TABS: 4.0000 mg | ORAL_TABLET | ORAL | Status: DC | PRN

## 2021-10-01 MED ORDER — MEASLES, MUMPS & RUBELLA VAC IJ SOLR: 0.5000 mL | Freq: Once | INTRAMUSCULAR | Status: DC

## 2021-10-01 MED ORDER — ACETAMINOPHEN 325 MG PO TABS: 650.0000 mg | ORAL_TABLET | ORAL | Status: DC | PRN

## 2021-10-01 MED ORDER — PRENATAL MULTIVITAMIN CH
1.0000 | ORAL_TABLET | Freq: Every day | ORAL | Status: DC
Start: 2021-10-01 — End: 2021-10-02
  Administered 2021-10-01 – 2021-10-02 (×2): 1 via ORAL
  Filled 2021-10-01 (×2): qty 1

## 2021-10-01 MED ORDER — SIMETHICONE 80 MG PO CHEW: 80.0000 mg | CHEWABLE_TABLET | ORAL | Status: DC | PRN

## 2021-10-01 MED ORDER — DIBUCAINE (PERIANAL) 1 % EX OINT: 1.0000 "application " | TOPICAL_OINTMENT | CUTANEOUS | Status: DC | PRN

## 2021-10-01 MED ORDER — COCONUT OIL OIL: 1.0000 "application " | TOPICAL_OIL | Status: DC | PRN

## 2021-10-01 MED ORDER — OXYCODONE HCL 5 MG PO TABS: 5.0000 mg | ORAL_TABLET | ORAL | Status: DC | PRN

## 2021-10-01 MED ORDER — IBUPROFEN 600 MG PO TABS
600.0000 mg | ORAL_TABLET | Freq: Four times a day (QID) | ORAL | Status: DC
  Administered 2021-10-01 – 2021-10-02 (×6): 600 mg via ORAL
  Filled 2021-10-01 (×6): qty 1

## 2021-10-01 MED ORDER — ONDANSETRON HCL 4 MG/2ML IJ SOLN: 4.0000 mg | INTRAMUSCULAR | Status: DC | PRN

## 2021-10-01 MED ORDER — TETANUS-DIPHTH-ACELL PERTUSSIS 5-2.5-18.5 LF-MCG/0.5 IM SUSY: 0.5000 mL | PREFILLED_SYRINGE | Freq: Once | INTRAMUSCULAR | Status: DC

## 2021-10-01 MED ORDER — DIPHENHYDRAMINE HCL 25 MG PO CAPS: 25.0000 mg | ORAL_CAPSULE | Freq: Four times a day (QID) | ORAL | Status: DC | PRN

## 2021-10-01 MED ORDER — ZOLPIDEM TARTRATE 5 MG PO TABS: 5.0000 mg | ORAL_TABLET | Freq: Every evening | ORAL | Status: DC | PRN

## 2021-10-01 NOTE — Lactation Note (Signed)
This note was copied from a baby's chart. ?Lactation Consultation Note ?RN told LC mom doesn't want Lactation to come and see her unless she calls for them. ? ?Patient Name: Lori Donaldson ?Today's Date: 10/01/2021 ?  ?Age:35 hours ? ?Maternal Data ?  ? ?Feeding ?  ? ?LATCH Score ?  ? ?  ? ?  ? ?  ? ?  ? ?  ? ? ?Lactation Tools Discussed/Used ?  ? ?Interventions ?  ? ?Discharge ?  ? ?Consult Status ?  ? ? ? ?Charyl Dancer ?10/01/2021, 12:39 AM ? ? ? ?

## 2021-10-01 NOTE — Lactation Note (Signed)
This note was copied from a baby's chart. ?Lactation Consultation Note ?LC went to see mom by mistake. Admitting RN in room just finished assessing baby and giving shots. Mom holding baby STS. ?Mom very receptive to [facility] not mentioning she wanted LC by PRN. ?LC asked mom if she would like to pump mom stated OK so LC set up DEBP and demonstrated how to use. ?Newborn feeding habits, behavior, STS, I&O, supply and demand reviewed. ?Mom BF her 1st child now 85 yrs old for 3 months. Mom encouraged to feed baby 8-12 times/24 hours and with feeding cues.   ?Asked mom if she has colostrum mom stated she has been leaking some. Praised mom.  ?Hand expression demonstrated w/colostrum easily expressed. ? ?When LC came back to chart, LC realized LC went to wrong room. LC went back to apologized for seeing her and she was PRN. Mom stated it was fine. LC thanked mom and encouraged to call for assistance or questions if needed. ? ?Patient Name: Lori Donaldson ?Today's Date: 10/01/2021 ?Reason for consult: Initial assessment;Early term 37-38.6wks ?Age:60 hours ? ?Maternal Data ?Has patient been taught Hand Expression?: Yes ?Does the patient have breastfeeding experience prior to this delivery?: Yes ?How long did the patient breastfeed?: 3 months ? ?Feeding ?  ? ?LATCH Score ?  ? ?  ? ?Type of Nipple: Everted at rest and after stimulation ? ?Comfort (Breast/Nipple): Soft / non-tender ? ?  ? ?  ? ? ?Lactation Tools Discussed/Used ?Tools: Pump ?Breast pump type: Double-Electric Breast Pump ?Pump Education: Setup, frequency, and cleaning ?Pumping frequency: PRN ? ?Interventions ?Interventions: Breast feeding basics reviewed;Skin to skin;Hand express;DEBP;LC Services brochure ? ?Discharge ?  ? ?Consult Status ?Consult Status: PRN ?Date: 10/01/21 ?Follow-up type: In-patient ? ? ? ?Charyl Dancer ?10/01/2021, 1:34 AM ? ? ? ?

## 2021-10-01 NOTE — Social Work (Signed)
MOB was referred for history of anxiety. ? ?* Referral screened out by Clinical Social Worker because none of the following criteria appear to apply: ?~ History of anxiety/depression during this pregnancy, or of post-partum depression following prior delivery. ?~ Diagnosis of anxiety and/or depression within last 3 years ?OR ?* MOB's symptoms currently being treated with medication and/or therapy. Per chart review, MOB sees a couselor to help manage anxiety symptoms, no concerns noted in the OB notes.  ? ?Please contact the Clinical Social Worker if needs arise, by MOB request, or if MOB scores greater than 9/yes to question 10 on Edinburgh Postpartum Depression Screen.  ? ?Lori Donaldson, MSW, LCSW ?[facility]  ?Clinical Social Worker  ?[phone] ?10/01/2021  8:55 AM  ?

## 2021-10-01 NOTE — Progress Notes (Signed)
Patient ID: Lori Donaldson, female   DOB: 02-13-1986, 36 y.o.   MRN: [id number] ?Post Partum Day 1 ?Subjective: ?no complaints, voiding, tolerating PO, and + flatus. She states she has gotten a little bit of sleep, but is tired. Pt got up once overnight with nurse to void. Has no pain with ambulation, but was still feeling some numbness and tingling from the epidural in her left leg at the time. Her bleeding was not concerning when checking at that time. Plans on breastfeeding and supplementing with formula as needed. ? ?Objective: ?Blood pressure 115/76, pulse 92, temperature 98.1 ?F (36.7 ?C), temperature source Oral, resp. rate 18, height 5\' 4"  (1.626 m), weight 99.1 kg, last menstrual period 12/27/2020, SpO2 98 %, unknown if currently breastfeeding. ? ?Physical Exam:  ?General: alert, cooperative, and fatigued ?Lochia: appropriate ?Uterine Fundus: firm ?DVT Evaluation: No evidence of DVT seen on physical exam. ? ?Recent Labs  ?  09/30/21 ?1245  ?HGB 12.4  ?HCT 37.2  ? ? ?Assessment/Plan: ?Plan for discharge tomorrow.  ? ?#Circ: pt consented. Requests inpt circ prior to discharge ? ?#MOC: Pt interested in POPs ? ? ? LOS: 1 day  ? ?10/02/21 ?10/01/2021, 6:26 AM  ? ? ?

## 2021-10-01 NOTE — Anesthesia Postprocedure Evaluation (Signed)
Anesthesia Post Note ? ?Patient: Lori Donaldson ? ?Procedure(s) Performed: AN AD HOC LABOR EPIDURAL ? ?  ? ?Patient location during evaluation: Mother Baby ?Anesthesia Type: Epidural ?Level of consciousness: awake and alert ?Pain management: pain level controlled ?Vital Signs Assessment: post-procedure vital signs reviewed and stable ?Respiratory status: spontaneous breathing, nonlabored ventilation and respiratory function stable ?Cardiovascular status: stable ?Postop Assessment: no headache, no backache and epidural receding ?Anesthetic complications: no ? ? ?No notable events documented. ? ?Last Vitals:  ?Vitals:  ? 10/01/21 0122 10/01/21 0520  ?BP: 117/78 115/76  ?Pulse: 85 92  ?Resp: 18 18  ?Temp: 37.1 ?C 36.7 ?C  ?SpO2: 98% 98%  ?  ?Last Pain:  ?Vitals:  ? 10/01/21 0740  ?TempSrc:   ?PainSc: 0-No pain  ? ?Pain Goal: Patients Stated Pain Goal: 6 (09/30/21 1255) ? ?  ?  ?  ?  ?  ?  ?  ? ?Brandt Chaney ? ? ? ? ?

## 2021-10-02 MED ORDER — ACETAMINOPHEN 325 MG PO TABS: 650.0000 mg | ORAL_TABLET | ORAL | 0 refills | Status: DC | PRN

## 2021-10-02 MED ORDER — IBUPROFEN 600 MG PO TABS: 600.0000 mg | ORAL_TABLET | Freq: Four times a day (QID) | ORAL | 0 refills | Status: DC

## 2021-10-02 MED ORDER — NORETHINDRONE 0.35 MG PO TABS: 1.0000 | ORAL_TABLET | Freq: Every day | ORAL | 2 refills | Status: DC

## 2021-10-02 NOTE — Plan of Care (Signed)
?  Problem: Education: ?Goal: Knowledge of General Education information will improve ?Description: Including pain rating scale, medication(s)/side effects and non-pharmacologic comfort measures ?Outcome: Completed/Met ?  ?Problem: Clinical Measurements: ?Goal: Ability to maintain clinical measurements within normal limits will improve ?Outcome: Completed/Met ?Goal: Respiratory complications will improve ?Outcome: Completed/Met ?Goal: Cardiovascular complication will be avoided ?Outcome: Completed/Met ?  ?Problem: Activity: ?Goal: Risk for activity intolerance will decrease ?Outcome: Completed/Met ?  ?Problem: Elimination: ?Goal: Will not experience complications related to bowel motility ?Outcome: Completed/Met ?Goal: Will not experience complications related to urinary retention ?Outcome: Completed/Met ?  ?Problem: Pain Managment: ?Goal: General experience of comfort will improve ?Outcome: Completed/Met ?  ?Problem: Safety: ?Goal: Ability to remain free from injury will improve ?Outcome: Completed/Met ?  ?Problem: Skin Integrity: ?Goal: Risk for impaired skin integrity will decrease ?Outcome: Completed/Met ?  ?

## 2021-10-05 ENCOUNTER — Encounter: Payer: BC Managed Care – PPO | Admitting: Women's Health

## 2021-10-11 ENCOUNTER — Telehealth (HOSPITAL_COMMUNITY): Payer: Self-pay | Admitting: *Deleted

## 2021-10-11 NOTE — Telephone Encounter (Signed)
Patient reported that she is continuing to experience uterine cramping, "but it seems to be getting better." Patient voiced no other questions or concerns at this time. EPDS=6. Patient voiced no questions or concerns regarding infant at this time. Patient reports infant co-sleeps or sleeps in a bassinet. Stated that he sleeps on his back when he is in the bassinet. RN reviewed ABCs of safe sleep. Patient verbalized understanding. Patient requested RN email information on hospital's virtual postpartum classes and support groups. Email sent. Deforest Hoyles, RN, 10/11/21, 1627   ?

## 2021-11-11 ENCOUNTER — Ambulatory Visit (INDEPENDENT_AMBULATORY_CARE_PROVIDER_SITE_OTHER): Payer: Commercial Managed Care - HMO | Admitting: Obstetrics & Gynecology

## 2021-11-11 ENCOUNTER — Encounter: Payer: Self-pay | Admitting: Obstetrics & Gynecology

## 2021-11-11 ENCOUNTER — Other Ambulatory Visit (HOSPITAL_COMMUNITY)
Admission: RE | Admit: 2021-11-11 | Discharge: 2021-11-11 | Disposition: A | Discharge status: Home / Self Care | Payer: Commercial Managed Care - HMO | Source: Ambulatory Visit | Attending: Obstetrics & Gynecology | Admitting: Obstetrics & Gynecology

## 2021-11-11 VITALS — BP 120/77 | HR 81 | Ht 64.0 in | Wt 195.8 lb

## 2021-11-11 DIAGNOSIS — Z01419 Encounter for gynecological examination (general) (routine) without abnormal findings: Secondary | ICD-10-CM

## 2021-11-11 NOTE — Progress Notes (Signed)
? ?POSTPARTUM VISIT ?Patient name: Lori Donaldson MRN [id number]  Date of birth: Nov 03, 1985 ?Chief Complaint:   ?Postpartum Care ? ?History of Present Illness:   ?Lori Donaldson is a 36 y.o. [phone] female being seen today for a postpartum visit. She is 6 weeks postpartum following a spontaneous vaginal delivery at 78 gestational weeks.  ? ?Pregnancy uncomplicated. ? ?Last pap smear: 2020  ? ? ?Postpartum course has been uncomplicated.  ?Bleeding no bleeding. Bowel function is normal. Bladder function is normal. Urinary incontinence? Yes, fecal incontinence? No ?Patient is not sexually active. ?Desired contraception: POPs. Patient does want a pregnancy in the future.   ?Desired family size is 4 children.  ? Upstream - 11/11/21 1013   ? ?  ? Pregnancy Intention Screening  ? Does the patient want to become pregnant in the next year? Unsure   ? Does the patient's partner want to become pregnant in the next year? Unsure   ? Would the patient like to discuss contraceptive options today? No   ?  ? Contraception Wrap Up  ? Current Method Abstinence   ? End Method Oral Contraceptive   ? Contraception Counseling Provided No   ? ?  ?  ? ?  ? ?The pregnancy intention screening data noted above was reviewed. Potential methods of contraception were discussed. The patient elected to proceed with Oral Contraceptive. ? ?Edinburgh Postpartum Depression Screening: Negative ? Edinburgh Postnatal Depression Scale - 11/11/21 1014   ? ?  ? Edinburgh Postnatal Depression Scale:  In the Past 7 Days  ? I have been able to laugh and see the funny side of things. 0   ? I have looked forward with enjoyment to things. 0   ? I have blamed myself unnecessarily when things went wrong. 2   ? I have been anxious or worried for no good reason. 1   ? I have felt scared or panicky for no good reason. 1   ? Things have been getting on top of me. 1   ? I have been so unhappy that I have had difficulty sleeping. 0   ? I have felt sad or  miserable. 1   ? I have been so unhappy that I have been crying. 1   ? The thought of harming myself has occurred to me. 0   ? Edinburgh Postnatal Depression Scale Total 7   ? ?  ?  ? ?  ? ? ?Baby's course has been uncomplicated. Baby is feeding by breast: milk supply adequate. Infant has a pediatrician/family doctor? Yes.  Childcare strategy if returning to work/school: family member and babysitter.  Pt has material needs met for her and baby: Yes.   ? ?Review of Systems:   ?Pertinent items are noted in HPI ?Denies Abnormal vaginal discharge w/ itching/odor/irritation, headaches, visual changes, shortness of breath, chest pain, abdominal pain, severe nausea/vomiting, or problems with urination or bowel movements. ?Pertinent History Reviewed:  ?Reviewed past medical,surgical, obstetrical and family history.  ?Reviewed problem list, medications and allergies. ?OB History  ?Gravida Para Term Preterm AB Living  ?_0 ?SAB IAB Ectopic Multiple Live Births  ?      0 2  ?  ?# Outcome Date GA Lbr Len/2nd Weight Sex Delivery Anes PTL Lv  ?2 Term 09/30/21 34w4d20:17 / 00:42 6 lb 9.1 oz (2.98 kg) M Vag-Spont EPI  LIV  ?1 Term 01/07/18 435w0d0:00 / 00:28 5 lb 15.4 oz (2.705  kg) F Vag-Spont EPI N LIV  ? ?Physical Assessment:  ? ?Vitals:  ? 11/11/21 1014  ?BP: 120/77  ?Pulse: 81  ?Weight: 195 lb 12.8 oz (88.8 kg)  ?Height: _0  (1.626 m)  ?Body mass index is 33.61 kg/m?. ? ?     Physical Examination:  ? General appearance: alert, well appearing, and in no distress ? Mental status: normal mood, behavior, speech, dress, motor activity, and thought processes ? Skin: warm & dry  ? Cardiovascular: normal heart rate noted  ? Respiratory: normal respiratory effort, no distress  ? Breasts: no evidence of infection  ? Abdomen: soft, non-tender  ? Pelvic: normal external genitalia, vulva, vagina, cervix, uterus and adnexa, Pap obtained ? Rectal: no hemorrhoids ? Extremities: no edema ? ?Chaperone: Glenard Haring [place]   ?      ?No  results found for this or any previous visit (from the past 24 hour(s)).  ?Assessment & Plan:  ?1) Postpartum exam ?2) 6 wks s/p spontaneous vaginal delivery ?3) breast feeding ?4) Depression screening ?5) Contraception management: start POPs ? ?Essential components of care per ACOG recommendations: ? ?1.  Mood and well being:  ?If positive depression screen, discussed and plan developed.  ?If using tobacco we discussed reduction/cessation and risk of relapse ?If current substance abuse, we discussed and referral to local resources was offered.  ? ?2. Infant care and feeding:  ?If breastfeeding, discussed returning to work, pumping, breastfeeding-associated pain, guidance regarding return to fertility while lactating if not using another method ?Recommended that all caregivers be immunized for flu, pertussis and other preventable communicable diseases ? ?3. Sexuality, contraception and birth spacing ?Provided guidance regarding sexuality, management of dyspareunia, and resumption of intercourse ?Discussed avoiding interpregnancy interval <53mhs and recommended birth spacing of 18 months ? ?4. Sleep and fatigue ?Discussed coping options for fatigue and sleep disruption ?Encouraged family/partner/community support of 4 hrs of uninterrupted sleep to help with mood and fatigue ? ?5. Physical recovery  ?If pt had a C/S, assessed incisional pain and providing guidance on normal vs prolonged recovery ?If pt had a laceration, perineal healing and pain reviewed.  ?If urinary or fecal incontinence, discussed management and referred to PT or uro/gyn if indicated  ?Patient is safe to resume physical activity. Discussed attainment of healthy weight. ? ?6. Health maintenance ?Mammogram at [place] earlier if indicated ?Pap smears collected ? ?Meds: No orders of the defined types were placed in this encounter. ? ? ?Follow-up: Return in about 1 year (around 11/12/2022) for Annual.  ? ?No orders of the defined types were placed in this  encounter. ? ? ?JJanyth Pupa DO ?Attending [facility] ?[facility]? ? ?  ?

## 2021-11-14 LAB — CYTOLOGY - PAP
Comment: NEGATIVE
Diagnosis: NEGATIVE
High risk HPV: NEGATIVE

## 2021-12-07 ENCOUNTER — Telehealth: Payer: Self-pay | Admitting: Obstetrics & Gynecology

## 2021-12-07 ENCOUNTER — Encounter: Payer: Self-pay | Admitting: Advanced Practice Midwife

## 2021-12-07 ENCOUNTER — Other Ambulatory Visit: Payer: Self-pay | Admitting: Advanced Practice Midwife

## 2021-12-07 MED ORDER — FLUCONAZOLE 200 MG PO TABS: 200.0000 mg | ORAL_TABLET | Freq: Every day | ORAL | 0 refills | Status: DC

## 2021-12-07 NOTE — Telephone Encounter (Signed)
Patient's baby was diagnosed with Thrush. Pediatrician advised her to get in touch with [place], so she can be treated as well as she is breastfeeding. Please advise.

## 2021-12-08 ENCOUNTER — Encounter: Payer: Self-pay | Admitting: *Deleted

## 2022-07-10 NOTE — L&D Delivery Note (Signed)
Delivery Note At 11:32 AM a viable female was delivered via Vaginal, Spontaneous (Presentation: Left Occiput Anterior).  APGAR: 9, 9; weight 7 lb 1.6 oz (3220 g).   Placenta status: Spontaneous, Intact.  Cord: 3 vessels with the following complications: None.  Cord pH: NA  Anesthesia: Epidural Episiotomy: None Lacerations: None Suture Repair:  none Est. Blood Loss (mL): 100  Mom to postpartum.  Baby to Couplet care / Skin to Skin.  Myna Hidalgo, DO Attending Obstetrician & Gynecologist, [facility] for [employer], [facility]

## 2022-07-11 ENCOUNTER — Other Ambulatory Visit: Payer: Self-pay | Admitting: Adult Health

## 2022-07-11 ENCOUNTER — Ambulatory Visit (INDEPENDENT_AMBULATORY_CARE_PROVIDER_SITE_OTHER): Payer: Commercial Managed Care - HMO | Admitting: *Deleted

## 2022-07-11 VITALS — BP 126/77 | HR 98 | Ht 64.0 in | Wt 203.8 lb

## 2022-07-11 DIAGNOSIS — Z3201 Encounter for pregnancy test, result positive: Secondary | ICD-10-CM | POA: Diagnosis not present

## 2022-07-11 DIAGNOSIS — N926 Irregular menstruation, unspecified: Secondary | ICD-10-CM

## 2022-07-11 DIAGNOSIS — Z789 Other specified health status: Secondary | ICD-10-CM | POA: Diagnosis not present

## 2022-07-11 LAB — POCT URINE PREGNANCY: Preg Test, Ur: POSITIVE — AB

## 2022-07-11 MED ORDER — PROMETHAZINE HCL 25 MG PO TABS: 25.0000 mg | ORAL_TABLET | Freq: Four times a day (QID) | ORAL | 1 refills | Status: DC | PRN

## 2022-07-11 NOTE — Progress Notes (Signed)
   NURSE VISIT- PREGNANCY CONFIRMATION   SUBJECTIVE:  Lori Donaldson is a 37 y.o. G70P2002 female at Unknown by uncertain LMP of No LMP recorded (lmp unknown). Patient is pregnant. Here for pregnancy confirmation.  Home pregnancy test: positive x 1   She reports nausea.  She is not taking prenatal vitamins.    OBJECTIVE:  BP 126/77 (BP Location: Right Arm, Patient Position: Sitting, Cuff Size: Normal)   Pulse 98   Ht 5\' 4"  (1.626 m)   Wt 203 lb 12.8 oz (92.4 kg)   LMP  (LMP Unknown)   Breastfeeding No   BMI 34.98 kg/m   Appears well, in no apparent distress  Results for orders placed or performed in visit on 07/11/22 (from the past 24 hour(s))  POCT urine pregnancy   Collection Time: 07/11/22  2:53 PM  Result Value Ref Range   Preg Test, Ur Positive (A) Negative    ASSESSMENT: Positive pregnancy test, Unknown by LMP    PLAN: Schedule for dating ultrasound in TBD based on HCG  Prenatal vitamins: plans to begin OTC ASAP   Nausea medicines: requested-note routed to Anderson [place], NP to send prescription   OB packet given: Yes  Alice Rieger  07/11/2022 2:59 PM

## 2022-07-11 NOTE — Progress Notes (Signed)
Rx phenergan 

## 2022-07-12 LAB — BETA HCG QUANT (REF LAB): hCG Quant: 80323 m[IU]/mL

## 2022-07-18 ENCOUNTER — Other Ambulatory Visit: Payer: Self-pay

## 2022-07-19 ENCOUNTER — Other Ambulatory Visit: Payer: Self-pay | Admitting: Obstetrics & Gynecology

## 2022-07-19 DIAGNOSIS — O3680X0 Pregnancy with inconclusive fetal viability, not applicable or unspecified: Secondary | ICD-10-CM

## 2022-07-20 ENCOUNTER — Ambulatory Visit (INDEPENDENT_AMBULATORY_CARE_PROVIDER_SITE_OTHER): Payer: Commercial Managed Care - HMO

## 2022-07-20 DIAGNOSIS — Z3A00 Weeks of gestation of pregnancy not specified: Secondary | ICD-10-CM

## 2022-07-20 DIAGNOSIS — O3680X0 Pregnancy with inconclusive fetal viability, not applicable or unspecified: Secondary | ICD-10-CM | POA: Diagnosis not present

## 2022-07-20 NOTE — Progress Notes (Signed)
[place] 9+6 wks,single IUP with yolk sac,CRL 29.79 mm,normal ovaries,FHR 177 bpm

## 2022-07-26 ENCOUNTER — Other Ambulatory Visit: Payer: Commercial Managed Care - HMO

## 2022-08-07 ENCOUNTER — Encounter: Payer: Commercial Managed Care - HMO | Admitting: *Deleted

## 2022-08-07 ENCOUNTER — Encounter: Payer: Commercial Managed Care - HMO | Admitting: Women's Health

## 2022-08-07 ENCOUNTER — Other Ambulatory Visit: Payer: Commercial Managed Care - HMO

## 2022-08-11 ENCOUNTER — Ambulatory Visit (INDEPENDENT_AMBULATORY_CARE_PROVIDER_SITE_OTHER): Payer: Commercial Managed Care - HMO | Admitting: Advanced Practice Midwife

## 2022-08-11 ENCOUNTER — Encounter: Payer: Self-pay | Admitting: Advanced Practice Midwife

## 2022-08-11 ENCOUNTER — Encounter: Payer: Commercial Managed Care - HMO | Admitting: *Deleted

## 2022-08-11 ENCOUNTER — Other Ambulatory Visit: Payer: Self-pay | Admitting: Radiology

## 2022-08-11 VITALS — BP 129/89 | HR 86 | Wt 208.0 lb

## 2022-08-11 DIAGNOSIS — Z3A13 13 weeks gestation of pregnancy: Secondary | ICD-10-CM

## 2022-08-11 DIAGNOSIS — Z3480 Encounter for supervision of other normal pregnancy, unspecified trimester: Secondary | ICD-10-CM

## 2022-08-11 DIAGNOSIS — Z363 Encounter for antenatal screening for malformations: Secondary | ICD-10-CM | POA: Diagnosis not present

## 2022-08-11 DIAGNOSIS — Z3490 Encounter for supervision of normal pregnancy, unspecified, unspecified trimester: Secondary | ICD-10-CM | POA: Insufficient documentation

## 2022-08-11 DIAGNOSIS — O09529 Supervision of elderly multigravida, unspecified trimester: Secondary | ICD-10-CM | POA: Insufficient documentation

## 2022-08-11 DIAGNOSIS — O09522 Supervision of elderly multigravida, second trimester: Secondary | ICD-10-CM | POA: Diagnosis not present

## 2022-08-11 DIAGNOSIS — O0990 Supervision of high risk pregnancy, unspecified, unspecified trimester: Secondary | ICD-10-CM | POA: Insufficient documentation

## 2022-08-11 MED ORDER — ASPIRIN 81 MG PO CHEW: 162.0000 mg | CHEWABLE_TABLET | Freq: Every day | ORAL | 7 refills | Status: DC

## 2022-08-11 NOTE — Patient Instructions (Signed)
Lori Donaldson, thank you for choosing our office today! We appreciate the opportunity to meet your healthcare needs. You may receive a short survey by mail, e-mail, or through [employer]. If you are happy with your care we would appreciate if you could take just a few minutes to complete the survey questions. We read all of your comments and take your feedback very seriously. Thank you again for choosing our office.  [facility] [employer] [facility] Team at [place] at [facility] ([place], [place] [address]) Entrance C, located off of [address]   Nausea & Vomiting Have saltine crackers or pretzels by your bed and eat a few bites before you raise your head out of bed in the morning Eat small frequent meals throughout the day instead of large meals Drink plenty of fluids throughout the day to stay hydrated, just don't drink a lot of fluids with your meals.  This can make your stomach fill up faster making you feel sick Do not brush your teeth right after you eat Products with real ginger are good for nausea, like ginger ale and ginger hard candy Make sure it says made with real ginger! Sucking on sour candy like lemon heads is also good for nausea If your prenatal vitamins make you nauseated, take them at night so you will sleep through the nausea Sea Bands If you feel like you need medicine for the nausea & vomiting please let us know If you are unable to keep any fluids or food down please let us know   Constipation Drink plenty of fluid, preferably water, throughout the day Eat foods high in fiber such as fruits, vegetables, and grains Exercise, such as walking, is a good way to keep your bowels regular Drink warm fluids, especially warm prune juice, or decaf coffee Eat a 1/2 cup of real oatmeal (not instant), 1/2 cup applesauce, and 1/2-1 cup warm prune juice every day If needed, you may take Colace (docusate sodium) stool softener  once or twice a day to help keep the stool soft.  If you still are having problems with constipation, you may take Miralax once daily as needed to help keep your bowels regular.   Home Blood Pressure Monitoring for Patients   Your provider has recommended that you check your blood pressure (BP) at least once a week at home. If you do not have a blood pressure cuff at home, one will be provided for you. Contact your provider if you have not received your monitor within 1 week.   Helpful Tips for Accurate Home Blood Pressure Checks  Don't smoke, exercise, or drink caffeine 30 minutes before checking your BP Use the restroom before checking your BP (a full bladder can raise your pressure) Relax in a comfortable upright chair Feet on the ground Left arm resting comfortably on a flat surface at the level of your heart Legs uncrossed Back supported Sit quietly and don't talk Place the cuff on your bare arm Adjust snuggly, so that only two fingertips can fit between your skin and the top of the cuff Check 2 readings separated by at least one minute Keep a log of your BP readings For a visual, please reference this diagram: http://ccnc.care/bpdiagram  Provider Name: [facility]     Phone: [phone]  Zone 1: ALL CLEAR  Continue to monitor your symptoms:  BP reading is less than 140 (top number) or less than 90 (bottom  number)  No right upper stomach pain No headaches or seeing spots No feeling nauseated or throwing up No swelling in face and hands  Zone 2: CAUTION Call your doctor's office for any of the following:  BP reading is greater than 140 (top number) or greater than 90 (bottom number)  Stomach pain under your ribs in the middle or right side Headaches or seeing spots Feeling nauseated or throwing up Swelling in face and hands  Zone 3: EMERGENCY  Seek immediate medical care if you have any of the following:  BP reading is greater than160 (top number) or greater than  110 (bottom number) Severe headaches not improving with Tylenol Serious difficulty catching your breath Any worsening symptoms from Zone 2    First Trimester of Pregnancy The first trimester of pregnancy is from week 1 until the end of week 12 (months 1 through 3). A week after a sperm fertilizes an egg, the egg will implant on the wall of the uterus. This embryo will begin to develop into a baby. Genes from you and your partner are forming the baby. The female genes determine whether the baby is a boy or a girl. At 6-8 weeks, the eyes and face are formed, and the heartbeat can be seen on ultrasound. At the end of 12 weeks, all the baby's organs are formed.  Now that you are pregnant, you will want to do everything you can to have a healthy baby. Two of the most important things are to get good prenatal care and to follow your health care provider's instructions. Prenatal care is all the medical care you receive before the baby's birth. This care will help prevent, find, and treat any problems during the pregnancy and childbirth. BODY CHANGES Your body goes through many changes during pregnancy. The changes vary from woman to woman.  You may gain or lose a couple of pounds at first. You may feel sick to your stomach (nauseous) and throw up (vomit). If the vomiting is uncontrollable, call your health care provider. You may tire easily. You may develop headaches that can be relieved by medicines approved by your health care provider. You may urinate more often. Painful urination may mean you have a bladder infection. You may develop heartburn as a result of your pregnancy. You may develop constipation because certain hormones are causing the muscles that push waste through your intestines to slow down. You may develop hemorrhoids or swollen, bulging veins (varicose veins). Your breasts may begin to grow larger and become tender. Your nipples may stick out more, and the tissue that surrounds them  (areola) may become darker. Your gums may bleed and may be sensitive to brushing and flossing. Dark spots or blotches (chloasma, mask of pregnancy) may develop on your face. This will likely fade after the baby is born. Your menstrual periods will stop. You may have a loss of appetite. You may develop cravings for certain kinds of food. You may have changes in your emotions from day to day, such as being excited to be pregnant or being concerned that something may go wrong with the pregnancy and baby. You may have more vivid and strange dreams. You may have changes in your hair. These can include thickening of your hair, rapid growth, and changes in texture. Some women also have hair loss during or after pregnancy, or hair that feels dry or thin. Your hair will most likely return to normal after your baby is born. WHAT TO EXPECT AT YOUR PRENATAL  VISITS During a routine prenatal visit: You will be weighed to make sure you and the baby are growing normally. Your blood pressure will be taken. Your abdomen will be measured to track your baby's growth. The fetal heartbeat will be listened to starting around week 10 or 12 of your pregnancy. Test results from any previous visits will be discussed. Your health care provider may ask you: How you are feeling. If you are feeling the baby move. If you have had any abnormal symptoms, such as leaking fluid, bleeding, severe headaches, or abdominal cramping. If you have any questions. Other tests that may be performed during your first trimester include: Blood tests to find your blood type and to check for the presence of any previous infections. They will also be used to check for low iron levels (anemia) and Rh antibodies. Later in the pregnancy, blood tests for diabetes will be done along with other tests if problems develop. Urine tests to check for infections, diabetes, or protein in the urine. An ultrasound to confirm the proper growth and development  of the baby. An amniocentesis to check for possible genetic problems. Fetal screens for spina bifida and Down syndrome. You may need other tests to make sure you and the baby are doing well. HOME CARE INSTRUCTIONS  Medicines Follow your health care provider's instructions regarding medicine use. Specific medicines may be either safe or unsafe to take during pregnancy. Take your prenatal vitamins as directed. If you develop constipation, try taking a stool softener if your health care provider approves. Diet Eat regular, well-balanced meals. Choose a variety of foods, such as meat or vegetable-based protein, fish, milk and low-fat dairy products, vegetables, fruits, and whole grain breads and cereals. Your health care provider will help you determine the amount of weight gain that is right for you. Avoid raw meat and uncooked cheese. These carry germs that can cause birth defects in the baby. Eating four or five small meals rather than three large meals a day may help relieve nausea and vomiting. If you start to feel nauseous, eating a few soda crackers can be helpful. Drinking liquids between meals instead of during meals also seems to help nausea and vomiting. If you develop constipation, eat more high-fiber foods, such as fresh vegetables or fruit and whole grains. Drink enough fluids to keep your urine clear or pale yellow. Activity and Exercise Exercise only as directed by your health care provider. Exercising will help you: Control your weight. Stay in shape. Be prepared for labor and delivery. Experiencing pain or cramping in the lower abdomen or low back is a good sign that you should stop exercising. Check with your health care provider before continuing normal exercises. Try to avoid standing for long periods of time. Move your legs often if you must stand in one place for a long time. Avoid heavy lifting. Wear low-heeled shoes, and practice good posture. You may continue to have sex  unless your health care provider directs you otherwise. Relief of Pain or Discomfort Wear a good support bra for breast tenderness.   Take warm sitz baths to soothe any pain or discomfort caused by hemorrhoids. Use hemorrhoid cream if your health care provider approves.   Rest with your legs elevated if you have leg cramps or low back pain. If you develop varicose veins in your legs, wear support hose. Elevate your feet for 15 minutes, 3-4 times a day. Limit salt in your diet. Prenatal Care Schedule your prenatal visits by the  twelfth week of pregnancy. They are usually scheduled monthly at first, then more often in the last 2 months before delivery. Write down your questions. Take them to your prenatal visits. Keep all your prenatal visits as directed by your health care provider. Safety Wear your seat belt at all times when driving. Make a list of emergency phone numbers, including numbers for family, friends, the hospital, and police and fire departments. General Tips Ask your health care provider for a referral to a local prenatal education class. Begin classes no later than at the beginning of month 6 of your pregnancy. Ask for help if you have counseling or nutritional needs during pregnancy. Your health care provider can offer advice or refer you to specialists for help with various needs. Do not use hot tubs, steam rooms, or saunas. Do not douche or use tampons or scented sanitary pads. Do not cross your legs for long periods of time. Avoid cat litter boxes and soil used by cats. These carry germs that can cause birth defects in the baby and possibly loss of the fetus by miscarriage or stillbirth. Avoid all smoking, herbs, alcohol, and medicines not prescribed by your health care provider. Chemicals in these affect the formation and growth of the baby. Schedule a dentist appointment. At home, brush your teeth with a soft toothbrush and be gentle when you floss. SEEK MEDICAL CARE IF:   You have dizziness. You have mild pelvic cramps, pelvic pressure, or nagging pain in the abdominal area. You have persistent nausea, vomiting, or diarrhea. You have a bad smelling vaginal discharge. You have pain with urination. You notice increased swelling in your face, hands, legs, or ankles. SEEK IMMEDIATE MEDICAL CARE IF:  You have a fever. You are leaking fluid from your vagina. You have spotting or bleeding from your vagina. You have severe abdominal cramping or pain. You have rapid weight gain or loss. You vomit blood or material that looks like coffee grounds. You are exposed to [place] measles and have never had them. You are exposed to fifth disease or chickenpox. You develop a severe headache. You have shortness of breath. You have any kind of trauma, such as from a fall or a car accident. Document Released: 06/20/2001 Document Revised: 11/10/2013 Document Reviewed: 05/06/2013 [facility] Patient Information 2015 [place], [place]. This information is not intended to replace advice given to you by your health care provider. Make sure you discuss any questions you have with your health care provider.

## 2022-08-11 NOTE — Progress Notes (Signed)
INITIAL OBSTETRICAL VISIT Patient name: Lori Donaldson MRN [id number]  Date of birth: April 24, 1986 Chief Complaint:   Initial Prenatal Visit (headaches)  History of Present Illness:   Lori Donaldson is a 37 y.o. G43P2002 {race:25618} female at [redacted]w[redacted]d by {EDC:19197::"LMP c/w u/s at *** weeks","US at *** weeks"} with an Estimated Date of Delivery: 02/16/23 being seen today for her initial obstetrical visit.   No LMP recorded (lmp unknown). Patient is pregnant. Her obstetrical history is significant for {ob history:25619}.   Today she reports {pregnancy symptoms:25616::"no complaints"}.  Last pap May 2023. Results were: NILM w/ HRHPV negative     08/11/2022   11:37 AM 04/20/2021    2:45 PM 03/01/2021   10:41 AM  Depression screen PHQ 2/9  Decreased Interest 1 0 0  Down, Depressed, Hopeless 0 0 0  PHQ - 2 Score 1 0 0  Altered sleeping 1 0 0  Tired, decreased energy 2 1 1   Change in appetite 0 0 0  Feeling bad or failure about yourself  1 1 0  Trouble concentrating 0 0 0  Moving slowly or fidgety/restless 0 0 0  Suicidal thoughts 0 0 0  PHQ-9 Score 5 2 1         08/11/2022   11:38 AM 04/20/2021    2:45 PM 03/01/2021   10:41 AM  GAD 7 : Generalized Anxiety Score  Nervous, Anxious, on Edge 0 1 0  Control/stop worrying 0 0 0  Worry too much - different things 0 0 0  Trouble relaxing 0 0 0  Restless 0 0 0  Easily annoyed or irritable 1 1 1   Afraid - awful might happen 0 0 0  Total GAD 7 Score 1 2 1      Review of Systems:   Pertinent items are noted in HPI Denies cramping/contractions, leakage of fluid, vaginal bleeding, abnormal vaginal discharge w/ itching/odor/irritation, headaches, visual changes, shortness of breath, chest pain, abdominal pain, severe nausea/vomiting, or problems with urination or bowel movements unless otherwise stated above.  Pertinent History Reviewed:  Reviewed past medical,surgical, social, obstetrical and family history.  Reviewed problem list,  medications and allergies. OB History  Gravida Para Term Preterm AB Living  3 2 2     2   SAB IAB Ectopic Multiple Live Births        0 2    # Outcome Date GA Lbr Len/2nd Weight Sex Delivery Anes PTL Lv  3 Current           2 Term 09/30/21 [redacted]w[redacted]d 20:17 / 00:42 6 lb 9.1 oz (2.98 kg) M Vag-Spont EPI N LIV  1 Term 01/07/18 [redacted]w[redacted]d 20:00 / 00:28 5 lb 15.4 oz (2.705 kg) F Vag-Spont EPI N LIV   Physical Assessment:   Vitals:   08/11/22 1139  BP: 129/89  Pulse: 86  Weight: 208 lb (94.3 kg)  Body mass index is 35.7 kg/m.       Physical Examination:  General appearance - well appearing, and in no distress  Mental status - alert, oriented to person, place, and time  Psych:  She has a normal mood and affect  Skin - warm and dry, normal color, no suspicious lesions noted  Chest - effort normal, all lung fields clear to auscultation bilaterally  Heart - normal rate and regular rhythm  Abdomen - soft, nontender; FHT 158bpm  Extremities:  No swelling or varicosities noted  Pelvic - not indicated  Thin prep pap is not done   No  results found for this or any previous visit (from the past 24 hour(s)).  Assessment & Plan:  1) Low-Risk Pregnancy G3P2002 at [redacted]w[redacted]d with an Estimated Date of Delivery: 02/16/23   2) Initial OB visit  3) AMA, rx bASA  4) Anxiety  Meds:  Meds ordered this encounter  Medications   aspirin 81 MG chewable tablet    Sig: Chew 2 tablets (162 mg total) by mouth daily.    Dispense:  60 tablet    Refill:  7    Order Specific Question:   Supervising Provider    Answer:   Janyth Pupa [[id number]]    Initial labs obtained Continue prenatal vitamins Reviewed n/v relief measures and warning s/s to report Reviewed recommended weight gain based on pre-gravid BMI Encouraged well-balanced diet Genetic & carrier screening discussed: declines Panorama, NT/IT, AFP, and Horizon , Ultrasound discussed; fetal survey: requested CCNC completed> form faxed if has or is planning to  apply for medicaid The nature of [place] for [employer] with multiple MDs and other Advanced Practice Providers was explained to patient; also emphasized that fellows, residents, and students are part of our team. Does have home bp cuff. Office bp cuff given: no. Rx sent: n/a. Check bp weekly, let us know if consistently >140/90.   Indications for ASA therapy (per uptodate) OR Two or more of the following: Obesity (BMI>30 kg/m2) Yes Age ?35 years Yes  No indications for early A1C (per uptodate)  Follow-up: Return for 4wk LROB; 7wk LROB and anatomy u/s.   Orders Placed This Encounter  Procedures   Urine Culture   GC/Chlamydia Probe Amp   US OB Comp + 14 Wk   CBC/D/Plt+RPR+Rh+ABO+RubIgG...    Myrtis Ser CNM 08/11/2022 12:05 PM

## 2022-08-12 LAB — CBC/D/PLT+RPR+RH+ABO+RUBIGG...
Antibody Screen: NEGATIVE
Basophils Absolute: 0 10*3/uL (ref 0.0–0.2)
Basos: 0 %
EOS (ABSOLUTE): 0.1 10*3/uL (ref 0.0–0.4)
Eos: 1 %
HCV Ab: NONREACTIVE
HIV Screen 4th Generation wRfx: NONREACTIVE
Hematocrit: 40.8 % (ref 34.0–46.6)
Hemoglobin: 13.3 g/dL (ref 11.1–15.9)
Hepatitis B Surface Ag: NEGATIVE
Immature Grans (Abs): 0 10*3/uL (ref 0.0–0.1)
Immature Granulocytes: 0 %
Lymphocytes Absolute: 1.2 10*3/uL (ref 0.7–3.1)
Lymphs: 12 %
MCH: 28.9 pg (ref 26.6–33.0)
MCHC: 32.6 g/dL (ref 31.5–35.7)
MCV: 89 fL (ref 79–97)
Monocytes Absolute: 0.7 10*3/uL (ref 0.1–0.9)
Monocytes: 7 %
Neutrophils Absolute: 7.5 10*3/uL — ABNORMAL HIGH (ref 1.4–7.0)
Neutrophils: 80 %
Platelets: 342 10*3/uL (ref 150–450)
RBC: 4.6 x10E6/uL (ref 3.77–5.28)
RDW: 13 % (ref 11.7–15.4)
RPR Ser Ql: NONREACTIVE
Rh Factor: POSITIVE
Rubella Antibodies, IGG: 1.08 index (ref >0.99)
WBC: 9.5 10*3/uL (ref 3.4–10.8)

## 2022-08-12 LAB — HCV INTERPRETATION

## 2022-08-14 LAB — URINE CULTURE

## 2022-08-15 LAB — GC/CHLAMYDIA PROBE AMP
Chlamydia trachomatis, NAA: NEGATIVE
Neisseria Gonorrhoeae by PCR: NEGATIVE

## 2022-09-07 ENCOUNTER — Ambulatory Visit (INDEPENDENT_AMBULATORY_CARE_PROVIDER_SITE_OTHER): Payer: Commercial Managed Care - HMO | Admitting: Obstetrics and Gynecology

## 2022-09-07 ENCOUNTER — Encounter: Payer: Self-pay | Admitting: Obstetrics and Gynecology

## 2022-09-07 VITALS — BP 117/67 | HR 95 | Wt 211.0 lb

## 2022-09-07 DIAGNOSIS — Z3A16 16 weeks gestation of pregnancy: Secondary | ICD-10-CM

## 2022-09-07 DIAGNOSIS — Z3480 Encounter for supervision of other normal pregnancy, unspecified trimester: Secondary | ICD-10-CM

## 2022-09-07 DIAGNOSIS — O09522 Supervision of elderly multigravida, second trimester: Secondary | ICD-10-CM

## 2022-09-07 NOTE — Progress Notes (Signed)
Subjective:  Lori Donaldson is a 37 y.o. G3P2002 at 84w6dbeing seen today for ongoing prenatal care.  She is currently monitored for the following issues for this high-risk pregnancy and has Anxiety; Encounter for supervision of normal pregnancy, antepartum; and AMA (advanced maternal age) multigravida 35+ on their problem list.  Patient reports no complaints.  Contractions: Not present. Vag. Bleeding: None.  Movement: Present. Denies leaking of fluid.   The following portions of the patient's history were reviewed and updated as appropriate: allergies, current medications, past family history, past medical history, past social history, past surgical history and problem list. Problem list updated.  Objective:   Vitals:   09/07/22 1020  BP: 117/67  Pulse: 95  Weight: 211 lb (95.7 kg)    Fetal Status: Fetal Heart Rate (bpm): 145   Movement: Present     General:  Alert, oriented and cooperative. Patient is in no acute distress.  Skin: Skin is warm and dry. No rash noted.   Cardiovascular: Normal heart rate noted  Respiratory: Normal respiratory effort, no problems with respiration noted  Abdomen: Soft, gravid, appropriate for gestational age. Pain/Pressure: Absent     Pelvic:  Cervical exam deferred        Extremities: Normal range of motion.  Edema: None  Mental Status: Normal mood and affect. Normal behavior. Normal judgment and thought content.   Urinalysis:      Assessment and Plan:  Pregnancy: G3P2002 at 133w6d1. Supervision of other normal pregnancy, antepartum Stable Anatomy scan scheduled  2. Multigravida of advanced maternal age in second trimester Declines genetic testing  Preterm labor symptoms and general obstetric precautions including but not limited to vaginal bleeding, contractions, leaking of fluid and fetal movement were reviewed in detail with the patient. Please refer to After Visit Summary for other counseling recommendations.  Return in about 4 weeks  (around 10/05/2022) for OB visit, face to face, any provider.   ErChancy MilroyMD

## 2022-09-29 ENCOUNTER — Ambulatory Visit (INDEPENDENT_AMBULATORY_CARE_PROVIDER_SITE_OTHER): Payer: Commercial Managed Care - HMO

## 2022-09-29 ENCOUNTER — Ambulatory Visit (INDEPENDENT_AMBULATORY_CARE_PROVIDER_SITE_OTHER): Payer: Commercial Managed Care - HMO | Admitting: Advanced Practice Midwife

## 2022-09-29 VITALS — BP 113/77 | HR 98 | Wt 207.0 lb

## 2022-09-29 DIAGNOSIS — Z3480 Encounter for supervision of other normal pregnancy, unspecified trimester: Secondary | ICD-10-CM

## 2022-09-29 DIAGNOSIS — Z3A20 20 weeks gestation of pregnancy: Secondary | ICD-10-CM

## 2022-09-29 DIAGNOSIS — Z363 Encounter for antenatal screening for malformations: Secondary | ICD-10-CM | POA: Diagnosis not present

## 2022-09-29 DIAGNOSIS — O09522 Supervision of elderly multigravida, second trimester: Secondary | ICD-10-CM

## 2022-09-29 DIAGNOSIS — Z3482 Encounter for supervision of other normal pregnancy, second trimester: Secondary | ICD-10-CM

## 2022-09-29 NOTE — Progress Notes (Signed)
[place] 20 wks,cephalic,posterior placenta gr 0,normal ovaries,cx 3 cm,SVP of fluid 4.1 cm,FHR 138 bpm,EFW 349 g 66%,anatomy complete,no obvious abnormalities

## 2022-09-29 NOTE — Patient Instructions (Signed)
Lori Donaldson, thank you for choosing our office today! We appreciate the opportunity to meet your healthcare needs. You may receive a short survey by mail, e-mail, or through MyChart. If you are happy with your care we would appreciate if you could take just a few minutes to complete the survey questions. We read all of your comments and take your feedback very seriously. Thank you again for choosing our office.  [facility] Team at [facility] ([address] [place], [place] [address]) Entrance C, located off of [address] Free 24/7 valet parking  Go to [email or web address] to register for FREE online childbirth classes  Call the office ([phone]) or go to [facility] if: You begin to severe cramping Your water breaks.  Sometimes it is a big gush of fluid, sometimes it is just a trickle that keeps getting your panties wet or running down your legs You have vaginal bleeding.  It is normal to have a small amount of spotting if your cervix was checked.   [place] Pediatricians/Family Doctors [facility] ([facility]): [address], [phone]           [facility]: [address], [phone]                [facility] ([facility]): [address], [phone] (call to ask if accepting patients) [facility]: [address], [place], [phone]    [place] Pediatricians/Family Doctors [facility] ([facility]): [address], [phone] [facility]: [address], [phone] [facility]: [address], [phone]  [place] [facility]  [facility] ([facility]): [phone] [facility]: [address], [phone]   [place] Family Doctors [facility]: [address], [phone]  [place] [facility]  [facility]: [address], [phone]  Home Blood Pressure Monitoring for Patients   Your provider has recommended that you check your blood pressure (BP) at least once a week at home. If you do not have a blood pressure cuff at home, one will be provided for you. Contact your provider if you have not received your monitor within 1 week.   Helpful Tips for Accurate Home Blood Pressure Checks  Don't smoke, exercise, or drink caffeine 30 minutes before checking your BP Use the restroom before checking your BP (a full bladder can raise your pressure) Relax in a comfortable upright chair Feet on the ground Left arm resting comfortably on a flat surface at the level of your heart Legs uncrossed Back supported Sit quietly and don't talk Place the cuff on your bare arm Adjust snuggly, so that only two fingertips can fit between your skin and the top of the cuff Check 2 readings separated by at least one minute Keep a log of your BP readings For a visual, please reference this diagram: http://ccnc.care/bpdiagram  Provider Name: [facility]     Phone: [phone]  Zone 1: ALL CLEAR  Continue to monitor your symptoms:  BP reading is less than 140 (top number) or less than 90 (bottom number)  No right upper stomach pain No headaches or seeing spots No feeling nauseated or throwing up No swelling in face and hands  Zone 2: CAUTION Call your doctor's office for any of the following:  BP reading is greater than 140 (top number) or greater than   90 (bottom number)  Stomach pain under your ribs in the middle or right side Headaches or seeing spots Feeling nauseated or throwing up Swelling in face and hands  Zone 3: EMERGENCY  Seek immediate medical care if you have any of the following:  BP reading is greater than160 (top number) or greater than 110 (bottom number) Severe headaches not improving with Tylenol Serious difficulty catching your breath Any worsening symptoms from  Zone 2     Second Trimester of Pregnancy The second trimester is from week 14 through week 27 (months 4 through 6). The second trimester is often a time when you feel your best. Your body has adjusted to being pregnant, and you begin to feel better physically. Usually, morning sickness has lessened or quit completely, you may have more energy, and you may have an increase in appetite. The second trimester is also a time when the fetus is growing rapidly. At the end of the sixth month, the fetus is about 9 inches long and weighs about 1 pounds. You will likely begin to feel the baby move (quickening) between 16 and 20 weeks of pregnancy. Body changes during your second trimester Your body continues to go through many changes during your second trimester. The changes vary from woman to woman. Your weight will continue to increase. You will notice your lower abdomen bulging out. You may begin to get stretch marks on your hips, abdomen, and breasts. You may develop headaches that can be relieved by medicines. The medicines should be approved by your health care provider. You may urinate more often because the fetus is pressing on your bladder. You may develop or continue to have heartburn as a result of your pregnancy. You may develop constipation because certain hormones are causing the muscles that push waste through your intestines to slow down. You may develop hemorrhoids or swollen, bulging veins (varicose veins). You may have back pain. This is caused by: Weight gain. Pregnancy hormones that are relaxing the joints in your pelvis. A shift in weight and the muscles that support your balance. Your breasts will continue to grow and they will continue to become tender. Your gums may bleed and may be sensitive to brushing and flossing. Dark spots or blotches (chloasma, mask of pregnancy) may develop on your face. This will likely fade after the baby is born. A dark line from your belly button to  the pubic area (linea nigra) may appear. This will likely fade after the baby is born. You may have changes in your hair. These can include thickening of your hair, rapid growth, and changes in texture. Some women also have hair loss during or after pregnancy, or hair that feels dry or thin. Your hair will most likely return to normal after your baby is born.  What to expect at prenatal visits During a routine prenatal visit: You will be weighed to make sure you and the fetus are growing normally. Your blood pressure will be taken. Your abdomen will be measured to track your baby's growth. The fetal heartbeat will be listened to. Any test results from the previous visit will be discussed.  Your health care provider may ask you: How you are feeling. If you are feeling the baby move. If you have had any abnormal symptoms, such as leaking fluid, bleeding, severe headaches, or abdominal cramping. If you are using any tobacco products, including cigarettes, chewing tobacco, and electronic cigarettes. If you have any questions.  Other tests that may be performed during   your second trimester include: Blood tests that check for: Low iron levels (anemia). High blood sugar that affects pregnant women (gestational diabetes) between 24 and 28 weeks. Rh antibodies. This is to check for a protein on red blood cells (Rh factor). Urine tests to check for infections, diabetes, or protein in the urine. An ultrasound to confirm the proper growth and development of the baby. An amniocentesis to check for possible genetic problems. Fetal screens for spina bifida and Down syndrome. HIV (human immunodeficiency virus) testing. Routine prenatal testing includes screening for HIV, unless you choose not to have this test.  Follow these instructions at home: Medicines Follow your health care provider's instructions regarding medicine use. Specific medicines may be either safe or unsafe to take during  pregnancy. Take a prenatal vitamin that contains at least 600 micrograms (mcg) of folic acid. If you develop constipation, try taking a stool softener if your health care provider approves. Eating and drinking Eat a balanced diet that includes fresh fruits and vegetables, whole grains, good sources of protein such as meat, eggs, or tofu, and low-fat dairy. Your health care provider will help you determine the amount of weight gain that is right for you. Avoid raw meat and uncooked cheese. These carry germs that can cause birth defects in the baby. If you have low calcium intake from food, talk to your health care provider about whether you should take a daily calcium supplement. Limit foods that are high in fat and processed sugars, such as fried and sweet foods. To prevent constipation: Drink enough fluid to keep your urine clear or pale yellow. Eat foods that are high in fiber, such as fresh fruits and vegetables, whole grains, and beans. Activity Exercise only as directed by your health care provider. Most women can continue their usual exercise routine during pregnancy. Try to exercise for 30 minutes at least 5 days a week. Stop exercising if you experience uterine contractions. Avoid heavy lifting, wear low heel shoes, and practice good posture. A sexual relationship may be continued unless your health care provider directs you otherwise. Relieving pain and discomfort Wear a good support bra to prevent discomfort from breast tenderness. Take warm sitz baths to soothe any pain or discomfort caused by hemorrhoids. Use hemorrhoid cream if your health care provider approves. Rest with your legs elevated if you have leg cramps or low back pain. If you develop varicose veins, wear support hose. Elevate your feet for 15 minutes, 3-4 times a day. Limit salt in your diet. Prenatal Care Write down your questions. Take them to your prenatal visits. Keep all your prenatal visits as told by your health  care provider. This is important. Safety Wear your seat belt at all times when driving. Make a list of emergency phone numbers, including numbers for family, friends, the hospital, and police and fire departments. General instructions Ask your health care provider for a referral to a local prenatal education class. Begin classes no later than the beginning of month 6 of your pregnancy. Ask for help if you have counseling or nutritional needs during pregnancy. Your health care provider can offer advice or refer you to specialists for help with various needs. Do not use hot tubs, steam rooms, or saunas. Do not douche or use tampons or scented sanitary pads. Do not cross your legs for long periods of time. Avoid cat litter boxes and soil used by cats. These carry germs that can cause birth defects in the baby and possibly loss of the   fetus by miscarriage or stillbirth. Avoid all smoking, herbs, alcohol, and unprescribed drugs. Chemicals in these products can affect the formation and growth of the baby. Do not use any products that contain nicotine or tobacco, such as cigarettes and e-cigarettes. If you need help quitting, ask your health care provider. Visit your dentist if you have not gone yet during your pregnancy. Use a soft toothbrush to brush your teeth and be gentle when you floss. Contact a health care provider if: You have dizziness. You have mild pelvic cramps, pelvic pressure, or nagging pain in the abdominal area. You have persistent nausea, vomiting, or diarrhea. You have a bad smelling vaginal discharge. You have pain when you urinate. Get help right away if: You have a fever. You are leaking fluid from your vagina. You have spotting or bleeding from your vagina. You have severe abdominal cramping or pain. You have rapid weight gain or weight loss. You have shortness of breath with chest pain. You notice sudden or extreme swelling of your face, hands, ankles, feet, or legs. You  have not felt your baby move in over an hour. You have severe headaches that do not go away when you take medicine. You have vision changes. Summary The second trimester is from week 14 through week 27 (months 4 through 6). It is also a time when the fetus is growing rapidly. Your body goes through many changes during pregnancy. The changes vary from woman to woman. Avoid all smoking, herbs, alcohol, and unprescribed drugs. These chemicals affect the formation and growth your baby. Do not use any tobacco products, such as cigarettes, chewing tobacco, and e-cigarettes. If you need help quitting, ask your health care provider. Contact your health care provider if you have any questions. Keep all prenatal visits as told by your health care provider. This is important. This information is not intended to replace advice given to you by your health care provider. Make sure you discuss any questions you have with your health care provider. Document Released: 06/20/2001 Document Revised: 12/02/2015 Document Reviewed: 08/27/2012 Elsevier Interactive Patient Education  2017 [employer] Inc.  

## 2022-09-29 NOTE — Progress Notes (Signed)
   LOW-RISK PREGNANCY VISIT Patient name: Lori Donaldson MRN [id number]  Date of birth: [birth year] Chief Complaint:   Routine Prenatal Visit  History of Present Illness:   Lori Donaldson is a 37 y.o. G69P2002 female at [redacted]w[redacted]d with an Estimated Date of Delivery: 02/16/23 being seen today for ongoing management of a low-risk pregnancy.  Today she reports  having allergy symptoms; when she doesn't get enough sleep feels achey . Contractions: Not present. Vag. Bleeding: None.  Movement: Present. denies leaking of fluid. Review of Systems:   Pertinent items are noted in HPI Denies abnormal vaginal discharge w/ itching/odor/irritation, headaches, visual changes, shortness of breath, chest pain, abdominal pain, severe nausea/vomiting, or problems with urination or bowel movements unless otherwise stated above. Pertinent History Reviewed:  Reviewed past medical,surgical, social, obstetrical and family history.  Reviewed problem list, medications and allergies. Physical Assessment:   Vitals:   09/29/22 1004  BP: 113/77  Pulse: 98  Weight: 207 lb (93.9 kg)  Body mass index is 35.53 kg/m.        Physical Examination:   General appearance: Well appearing, and in no distress  Mental status: Alert, oriented to person, place, and time  Skin: Warm & dry  Cardiovascular: Normal heart rate noted  Respiratory: Normal respiratory effort, no distress  Abdomen: Soft, gravid, nontender  Pelvic: Cervical exam deferred         Extremities: Edema: None  Fetal Status: Fetal Heart Rate (bpm): 138 u/s   Movement: Present    Anatomy u/s: [place] 20 wks,cephalic,posterior placenta gr 0,normal ovaries,cx 3 cm,SVP of fluid 4.1 cm,FHR 138 bpm,EFW 349 g 66%,anatomy complete,no obvious abnormalities   No results found for this or any previous visit (from the past 24 hour(s)).  Assessment & Plan:  1) Low-risk pregnancy G3P2002 at [redacted]w[redacted]d with an Estimated Date of Delivery: 02/16/23   2) Allergies, Zyrtec D and  Mucinex DM are okay for congestion  3) Anxiety, stable; has support and therapist if needed   Meds: No orders of the defined types were placed in this encounter.  Labs/procedures today: anatomy u/s  Plan:  Continue routine obstetrical care   Reviewed: Preterm labor symptoms and general obstetric precautions including but not limited to vaginal bleeding, contractions, leaking of fluid and fetal movement were reviewed in detail with the patient.  All questions were answered. Has home bp cuff. Check bp weekly, let us know if >140/90.   Follow-up: Return in about 4 weeks (around 10/27/2022) for [place], in person.  No orders of the defined types were placed in this encounter.  Myrtis Ser CNM 09/29/2022 10:27 AM

## 2022-10-05 ENCOUNTER — Encounter: Payer: Commercial Managed Care - HMO | Admitting: Advanced Practice Midwife

## 2022-10-27 ENCOUNTER — Ambulatory Visit (INDEPENDENT_AMBULATORY_CARE_PROVIDER_SITE_OTHER): Payer: Commercial Managed Care - HMO | Admitting: Obstetrics & Gynecology

## 2022-10-27 ENCOUNTER — Encounter: Payer: Self-pay | Admitting: Obstetrics & Gynecology

## 2022-10-27 VITALS — BP 95/65 | HR 84 | Wt 215.0 lb

## 2022-10-27 DIAGNOSIS — Z3A24 24 weeks gestation of pregnancy: Secondary | ICD-10-CM

## 2022-10-27 DIAGNOSIS — K219 Gastro-esophageal reflux disease without esophagitis: Secondary | ICD-10-CM

## 2022-10-27 DIAGNOSIS — Z3482 Encounter for supervision of other normal pregnancy, second trimester: Secondary | ICD-10-CM

## 2022-10-27 DIAGNOSIS — Z3480 Encounter for supervision of other normal pregnancy, unspecified trimester: Secondary | ICD-10-CM

## 2022-10-27 MED ORDER — FAMOTIDINE 20 MG PO TABS: 20.0000 mg | ORAL_TABLET | Freq: Two times a day (BID) | ORAL | 11 refills | Status: DC

## 2022-10-27 NOTE — Progress Notes (Signed)
   LOW-RISK PREGNANCY VISIT Patient name: Lori Donaldson MRN [id number]  Date of birth: 08-17-1985 Chief Complaint:   Routine Prenatal Visit  History of Present Illness:   Lori Donaldson is a 37 y.o. G93P2002 female at [redacted]w[redacted]d with an Estimated Date of Delivery: 02/16/23 being seen today for ongoing management of a low-risk pregnancy.      08/11/2022   11:37 AM 04/20/2021    2:45 PM 03/01/2021   10:41 AM  Depression screen PHQ 2/9  Decreased Interest 1 0 0  Down, Depressed, Hopeless 0 0 0  PHQ - 2 Score 1 0 0  Altered sleeping 1 0 0  Tired, decreased energy Change in appetite 0 0 0  Feeling bad or failure about yourself  1 1 0  Trouble concentrating 0 0 0  Moving slowly or fidgety/restless 0 0 0  Suicidal thoughts 0 0 0  PHQ-9 Score Today she reports  notes upper abdominal pain no nausea or vomiting reports frequent Tums usage . Contractions: Not present.  .  Movement: Present. denies leaking of fluid. Review of Systems:   Pertinent items are noted in HPI Denies abnormal vaginal discharge w/ itching/odor/irritation, headaches, visual changes, shortness of breath, chest pain, abdominal pain, severe nausea/vomiting, or problems with urination or bowel movements unless otherwise stated above. Pertinent History Reviewed:  Reviewed past medical,surgical, social, obstetrical and family history.  Reviewed problem list, medications and allergies.  Physical Assessment:   Vitals:   10/27/22 1035  BP: 95/65  Pulse: 84  Weight: 215 lb (97.5 kg)  Body mass index is 36.9 kg/m.        Physical Examination:   General appearance: Well appearing, and in no distress  Mental status: Alert, oriented to person, place, and time  Skin: Warm & dry  Respiratory: Normal respiratory effort, no distress  Abdomen: Soft, gravid, nontender  Pelvic: Cervical exam deferred         Extremities: Edema: None  Psych:  mood and affect appropriate  Fetal Status: Fetal Heart Rate  (bpm): 140 Fundal Height: 24 cm Movement: Present    Chaperone: n/a    No results found for this or any previous visit (from the past 24 hour(s)).   Assessment & Plan:  1) Low-risk pregnancy G3P2002 at [redacted]w[redacted]d with an Estimated Date of Delivery: 02/16/23   2) Acid reflux -Plan to start Pepcid    Meds:  Meds ordered this encounter  Medications   famotidine (PEPCID) 20 MG tablet    Sig: Take 1 tablet (20 mg total) by mouth 2 (two) times daily.    Dispense:  60 tablet    Refill:  11   Labs/procedures today: None, PN-2 at next visit  Plan:  Continue routine obstetrical care  Next visit: prefers in person    Reviewed: Preterm labor symptoms and general obstetric precautions including but not limited to vaginal bleeding, contractions, leaking of fluid and fetal movement were reviewed in detail with the patient.  All questions were answered.  Patient has home bp cuff. Check bp weekly, let us know if >140/90.   Follow-up: Return in about 4 weeks (around 11/24/2022) for LROB visit and glucola.   Myna Hidalgo, DO Attending Obstetrician & Gynecologist, [facility] for [employer], [facility]

## 2022-11-24 ENCOUNTER — Ambulatory Visit (INDEPENDENT_AMBULATORY_CARE_PROVIDER_SITE_OTHER): Payer: Commercial Managed Care - HMO | Admitting: Obstetrics & Gynecology

## 2022-11-24 ENCOUNTER — Other Ambulatory Visit: Payer: Commercial Managed Care - HMO

## 2022-11-24 ENCOUNTER — Encounter: Payer: Self-pay | Admitting: Obstetrics & Gynecology

## 2022-11-24 VITALS — BP 110/72 | HR 79 | Wt 219.0 lb

## 2022-11-24 DIAGNOSIS — Z3A27 27 weeks gestation of pregnancy: Secondary | ICD-10-CM

## 2022-11-24 DIAGNOSIS — Z3480 Encounter for supervision of other normal pregnancy, unspecified trimester: Secondary | ICD-10-CM

## 2022-11-24 DIAGNOSIS — Z131 Encounter for screening for diabetes mellitus: Secondary | ICD-10-CM

## 2022-11-24 NOTE — Progress Notes (Signed)
   LOW-RISK PREGNANCY VISIT Patient name: Lori Donaldson MRN [id number]  Date of birth: 02-08-1986 Chief Complaint:   Routine Prenatal Visit (PN2)  History of Present Illness:   Lori Donaldson is a 37 y.o. G50P2002 female at [redacted]w[redacted]d with an Estimated Date of Delivery: 02/16/23 being seen today for ongoing management of a low-risk pregnancy.      08/11/2022   11:37 AM 04/20/2021    2:45 PM 03/01/2021   10:41 AM  Depression screen PHQ 2/9  Decreased Interest 1 0 0  Down, Depressed, Hopeless 0 0 0  PHQ - 2 Score 1 0 0  Altered sleeping 1 0 0  Tired, decreased energy 2 1 1   Change in appetite 0 0 0  Feeling bad or failure about yourself  1 1 0  Trouble concentrating 0 0 0  Moving slowly or fidgety/restless 0 0 0  Suicidal thoughts 0 0 0  PHQ-9 Score 5 2 1     Today she reports no complaints. Contractions: Not present. Vag. Bleeding: None.  Movement: Present. denies leaking of fluid. Review of Systems:   Pertinent items are noted in HPI Denies abnormal vaginal discharge w/ itching/odor/irritation, headaches, visual changes, shortness of breath, chest pain, abdominal pain, severe nausea/vomiting, or problems with urination or bowel movements unless otherwise stated above. Pertinent History Reviewed:  Reviewed past medical,surgical, social, obstetrical and family history.  Reviewed problem list, medications and allergies.  Physical Assessment:   Vitals:   11/24/22 0845  BP: 110/72  Pulse: 79  Weight: 219 lb (99.3 kg)  Body mass index is 37.59 kg/m.        Physical Examination:   General appearance: Well appearing, and in no distress  Mental status: Alert, oriented to person, place, and time  Skin: Warm & dry  Respiratory: Normal respiratory effort, no distress  Abdomen: Soft, gravid, nontender  Pelvic: Cervical exam deferred         Extremities: Edema: None  Psych:  mood and affect appropriate  Fetal Status: Fetal Heart Rate (bpm): 150 Fundal Height: 28 cm Movement:  Present    Chaperone: n/a    No results found for this or any previous visit (from the past 24 hour(s)).   Assessment & Plan:  1) Low-risk pregnancy G3P2002 at [redacted]w[redacted]d with an Estimated Date of Delivery: 02/16/23   -Reviewed contraceptive options, desires post placental IUD   Meds: No orders of the defined types were placed in this encounter.  Labs/procedures today: PN-2  Plan:  Continue routine obstetrical care  Next visit: prefers in person    Reviewed: Preterm labor symptoms and general obstetric precautions including but not limited to vaginal bleeding, contractions, leaking of fluid and fetal movement were reviewed in detail with the patient.  All questions were answered. Pt has home bp cuff. Check bp weekly, let us know if >140/90.   Follow-up: Return in about 2 weeks (around 12/08/2022) for LROB visit.  Myna Hidalgo, DO Attending Obstetrician & Gynecologist, [facility] for [employer], [facility]

## 2022-11-25 LAB — GLUCOSE TOLERANCE, 2 HOURS W/ 1HR
Glucose, 1 hour: 161 mg/dL (ref 70–179)
Glucose, 2 hour: 80 mg/dL (ref 70–152)
Glucose, Fasting: 96 mg/dL — ABNORMAL HIGH (ref 70–91)

## 2022-11-25 LAB — CBC
Hematocrit: 35.6 % (ref 34.0–46.6)
Hemoglobin: 11.8 g/dL (ref 11.1–15.9)
MCH: 29.1 pg (ref 26.6–33.0)
MCHC: 33.1 g/dL (ref 31.5–35.7)
MCV: 88 fL (ref 79–97)
Platelets: 305 10*3/uL (ref 150–450)
RBC: 4.05 x10E6/uL (ref 3.77–5.28)
RDW: 12.4 % (ref 11.7–15.4)
WBC: 9.1 10*3/uL (ref 3.4–10.8)

## 2022-11-25 LAB — RPR: RPR Ser Ql: NONREACTIVE

## 2022-11-25 LAB — HIV ANTIBODY (ROUTINE TESTING W REFLEX): HIV Screen 4th Generation wRfx: NONREACTIVE

## 2022-11-25 LAB — ANTIBODY SCREEN: Antibody Screen: NEGATIVE

## 2022-11-26 ENCOUNTER — Encounter: Payer: Self-pay | Admitting: Obstetrics & Gynecology

## 2022-11-26 DIAGNOSIS — Z8632 Personal history of gestational diabetes: Secondary | ICD-10-CM | POA: Insufficient documentation

## 2022-11-26 DIAGNOSIS — O24419 Gestational diabetes mellitus in pregnancy, unspecified control: Secondary | ICD-10-CM | POA: Insufficient documentation

## 2022-11-27 ENCOUNTER — Other Ambulatory Visit: Payer: Self-pay | Admitting: *Deleted

## 2022-11-27 DIAGNOSIS — O24410 Gestational diabetes mellitus in pregnancy, diet controlled: Secondary | ICD-10-CM

## 2022-11-27 MED ORDER — ACCU-CHEK SOFTCLIX LANCETS MISC
12 refills | Status: DC
Start: 2022-11-27 — End: 2023-02-10

## 2022-11-27 MED ORDER — ACCU-CHEK GUIDE ME W/DEVICE KIT
1.0000 | PACK | Freq: Four times a day (QID) | 0 refills | Status: DC
Start: 2022-11-27 — End: 2023-02-10

## 2022-11-27 MED ORDER — ACCU-CHEK GUIDE VI STRP
ORAL_STRIP | 12 refills | Status: DC
Start: 2022-11-27 — End: 2023-02-10

## 2022-12-11 ENCOUNTER — Ambulatory Visit (INDEPENDENT_AMBULATORY_CARE_PROVIDER_SITE_OTHER): Payer: Commercial Managed Care - HMO | Admitting: Women's Health

## 2022-12-11 ENCOUNTER — Encounter: Payer: Self-pay | Admitting: Women's Health

## 2022-12-11 VITALS — BP 105/62 | HR 92 | Wt 223.0 lb

## 2022-12-11 DIAGNOSIS — Z1389 Encounter for screening for other disorder: Secondary | ICD-10-CM

## 2022-12-11 DIAGNOSIS — Z3A30 30 weeks gestation of pregnancy: Secondary | ICD-10-CM

## 2022-12-11 DIAGNOSIS — O0993 Supervision of high risk pregnancy, unspecified, third trimester: Secondary | ICD-10-CM

## 2022-12-11 DIAGNOSIS — O24410 Gestational diabetes mellitus in pregnancy, diet controlled: Secondary | ICD-10-CM

## 2022-12-11 DIAGNOSIS — Z331 Pregnant state, incidental: Secondary | ICD-10-CM

## 2022-12-11 DIAGNOSIS — O0990 Supervision of high risk pregnancy, unspecified, unspecified trimester: Secondary | ICD-10-CM

## 2022-12-11 LAB — POCT URINALYSIS DIPSTICK OB
Blood, UA: NEGATIVE
Glucose, UA: NEGATIVE
Ketones, UA: NEGATIVE
Nitrite, UA: NEGATIVE
POC,PROTEIN,UA: NEGATIVE

## 2022-12-11 NOTE — Patient Instructions (Signed)
Lori Donaldson, thank you for choosing our office today! We appreciate the opportunity to meet your healthcare needs. You may receive a short survey by mail, e-mail, or through MyChart. If you are happy with your care we would appreciate if you could take just a few minutes to complete the survey questions. We read all of your comments and take your feedback very seriously. Thank you again for choosing our office.  [facility]  [facility] [place] ([address] [place], [place] [address]) Entrance C, located off of [address] Free 24/7 valet parking   CLASSES: Go to [email or web address] to register for classes (childbirth, breastfeeding, waterbirth, infant CPR, daddy bootcamp, etc.)  Call the office ([phone]) or go to [facility] if: You begin to have strong, frequent contractions Your water breaks.  Sometimes it is a big gush of fluid, sometimes it is just a trickle that keeps getting your panties wet or running down your legs You have vaginal bleeding.  It is normal to have a small amount of spotting if your cervix was checked.  You don't feel your baby moving like normal.  If you don't, get you something to eat and drink and lay down and focus on feeling your baby move.   If your baby is still not moving like normal, you should call the office or go to [facility].  Call the office ([phone]) or go to [facility] for these signs of pre-eclampsia: Severe headache that does not go away with Tylenol Visual changes- seeing spots, double, blurred vision Pain under your right breast or upper abdomen that does not go away with Tums or heartburn medicine Nausea and/or vomiting Severe swelling in your hands, feet, and face   Tdap Vaccine It is recommended that you get the Tdap vaccine during the third trimester of EACH pregnancy to help protect your baby from getting pertussis (whooping cough) 27-36 weeks is the BEST time to do  this so that you can pass the protection on to your baby. During pregnancy is better than after pregnancy, but if you are unable to get it during pregnancy it will be offered at the hospital.  You can get this vaccine with us, at the health department, your family doctor, or some local pharmacies Everyone who will be around your baby should also be up-to-date on their vaccines before the baby comes. Adults (who are not pregnant) only need 1 dose of Tdap during adulthood.   [place] Pediatricians/Family Doctors [facility] ([facility]): [address], [phone]           [facility]: [address], [phone]                [facility] ([facility]): [address], [phone] (call to ask if accepting patients) [facility]: [address], [place], [phone]    [place] Pediatricians/Family Doctors [facility] ([facility]): [address], [phone] [facility]: [address], [phone] [facility]: [address], [phone]  [place] [facility]  [facility] ([facility]): [phone] [facility]: [address], [phone]   [place] Family Doctors [facility]: [address], [phone]  [place] [facility]  [facility]: [address], [phone]  Home Blood Pressure Monitoring for Patients   Your provider has recommended that you check your   blood pressure (BP) at least once a week at home. If you do not have a blood pressure cuff at home, one will be provided for you. Contact your provider if you have not received your monitor within 1 week.   Helpful Tips for Accurate Home Blood Pressure Checks  Don't smoke, exercise, or drink caffeine 30 minutes before checking your BP Use the restroom before checking your BP (a full bladder can raise your  pressure) Relax in a comfortable upright chair Feet on the ground Left arm resting comfortably on a flat surface at the level of your heart Legs uncrossed Back supported Sit quietly and don't talk Place the cuff on your bare arm Adjust snuggly, so that only two fingertips can fit between your skin and the top of the cuff Check 2 readings separated by at least one minute Keep a log of your BP readings For a visual, please reference this diagram: http://ccnc.care/bpdiagram  Provider Name: [facility]     Phone: [phone]  Zone 1: ALL CLEAR  Continue to monitor your symptoms:  BP reading is less than 140 (top number) or less than 90 (bottom number)  No right upper stomach pain No headaches or seeing spots No feeling nauseated or throwing up No swelling in face and hands  Zone 2: CAUTION Call your doctor's office for any of the following:  BP reading is greater than 140 (top number) or greater than 90 (bottom number)  Stomach pain under your ribs in the middle or right side Headaches or seeing spots Feeling nauseated or throwing up Swelling in face and hands  Zone 3: EMERGENCY  Seek immediate medical care if you have any of the following:  BP reading is greater than160 (top number) or greater than 110 (bottom number) Severe headaches not improving with Tylenol Serious difficulty catching your breath Any worsening symptoms from Zone 2   Third Trimester of Pregnancy The third trimester is from week 29 through week 42, months 7 through 9. The third trimester is a time when the fetus is growing rapidly. At the end of the ninth month, the fetus is about 20 inches in length and weighs 6-10 pounds.  BODY CHANGES Your body goes through many changes during pregnancy. The changes vary from woman to woman.  Your weight will continue to increase. You can expect to gain 25-35 pounds (11-16 kg) by the end of the pregnancy. You may begin to get stretch marks on your hips, abdomen,  and breasts. You may urinate more often because the fetus is moving lower into your pelvis and pressing on your bladder. You may develop or continue to have heartburn as a result of your pregnancy. You may develop constipation because certain hormones are causing the muscles that push waste through your intestines to slow down. You may develop hemorrhoids or swollen, bulging veins (varicose veins). You may have pelvic pain because of the weight gain and pregnancy hormones relaxing your joints between the bones in your pelvis. Backaches may result from overexertion of the muscles supporting your posture. You may have changes in your hair. These can include thickening of your hair, rapid growth, and changes in texture. Some women also have hair loss during or after pregnancy, or hair that feels dry or thin. Your hair will most likely return to normal after your baby is born. Your breasts will continue to grow and be tender. A yellow discharge may leak from your breasts called colostrum. Your belly button may stick out. You may   feel short of breath because of your expanding uterus. You may notice the fetus "dropping," or moving lower in your abdomen. You may have a bloody mucus discharge. This usually occurs a few days to a week before labor begins. Your cervix becomes thin and soft (effaced) near your due date. WHAT TO EXPECT AT YOUR PRENATAL EXAMS  You will have prenatal exams every 2 weeks until week 36. Then, you will have weekly prenatal exams. During a routine prenatal visit: You will be weighed to make sure you and the fetus are growing normally. Your blood pressure is taken. Your abdomen will be measured to track your baby's growth. The fetal heartbeat will be listened to. Any test results from the previous visit will be discussed. You may have a cervical check near your due date to see if you have effaced. At around 36 weeks, your caregiver will check your cervix. At the same time, your  caregiver will also perform a test on the secretions of the vaginal tissue. This test is to determine if a type of bacteria, Group B streptococcus, is present. Your caregiver will explain this further. Your caregiver may ask you: What your birth plan is. How you are feeling. If you are feeling the baby move. If you have had any abnormal symptoms, such as leaking fluid, bleeding, severe headaches, or abdominal cramping. If you have any questions. Other tests or screenings that may be performed during your third trimester include: Blood tests that check for low iron levels (anemia). Fetal testing to check the health, activity level, and growth of the fetus. Testing is done if you have certain medical conditions or if there are problems during the pregnancy. FALSE LABOR You may feel small, irregular contractions that eventually go away. These are called Braxton Hicks contractions, or false labor. Contractions may last for hours, days, or even weeks before true labor sets in. If contractions come at regular intervals, intensify, or become painful, it is best to be seen by your caregiver.  SIGNS OF LABOR  Menstrual-like cramps. Contractions that are 5 minutes apart or less. Contractions that start on the top of the uterus and spread down to the lower abdomen and back. A sense of increased pelvic pressure or back pain. A watery or bloody mucus discharge that comes from the vagina. If you have any of these signs before the 37th week of pregnancy, call your caregiver right away. You need to go to the hospital to get checked immediately. HOME CARE INSTRUCTIONS  Avoid all smoking, herbs, alcohol, and unprescribed drugs. These chemicals affect the formation and growth of the baby. Follow your caregiver's instructions regarding medicine use. There are medicines that are either safe or unsafe to take during pregnancy. Exercise only as directed by your caregiver. Experiencing uterine cramps is a good sign to  stop exercising. Continue to eat regular, healthy meals. Wear a good support bra for breast tenderness. Do not use hot tubs, steam rooms, or saunas. Wear your seat belt at all times when driving. Avoid raw meat, uncooked cheese, cat litter boxes, and soil used by cats. These carry germs that can cause birth defects in the baby. Take your prenatal vitamins. Try taking a stool softener (if your caregiver approves) if you develop constipation. Eat more high-fiber foods, such as fresh vegetables or fruit and whole grains. Drink plenty of fluids to keep your urine clear or pale yellow. Take warm sitz baths to soothe any pain or discomfort caused by hemorrhoids. Use hemorrhoid cream if   your caregiver approves. If you develop varicose veins, wear support hose. Elevate your feet for 15 minutes, 3-4 times a day. Limit salt in your diet. Avoid heavy lifting, wear low heal shoes, and practice good posture. Rest a lot with your legs elevated if you have leg cramps or low back pain. Visit your dentist if you have not gone during your pregnancy. Use a soft toothbrush to brush your teeth and be gentle when you floss. A sexual relationship may be continued unless your caregiver directs you otherwise. Do not travel far distances unless it is absolutely necessary and only with the approval of your caregiver. Take prenatal classes to understand, practice, and ask questions about the labor and delivery. Make a trial run to the hospital. Pack your hospital bag. Prepare the baby's nursery. Continue to go to all your prenatal visits as directed by your caregiver. SEEK MEDICAL CARE IF: You are unsure if you are in labor or if your water has broken. You have dizziness. You have mild pelvic cramps, pelvic pressure, or nagging pain in your abdominal area. You have persistent nausea, vomiting, or diarrhea. You have a bad smelling vaginal discharge. You have pain with urination. SEEK IMMEDIATE MEDICAL CARE IF:  You  have a fever. You are leaking fluid from your vagina. You have spotting or bleeding from your vagina. You have severe abdominal cramping or pain. You have rapid weight loss or gain. You have shortness of breath with chest pain. You notice sudden or extreme swelling of your face, hands, ankles, feet, or legs. You have not felt your baby move in over an hour. You have severe headaches that do not go away with medicine. You have vision changes. Document Released: 06/20/2001 Document Revised: 07/01/2013 Document Reviewed: 08/27/2012 ExitCare Patient Information 2015 [employer]. This information is not intended to replace advice given to you by your health care provider. Make sure you discuss any questions you have with your health care provider.       

## 2022-12-11 NOTE — Progress Notes (Addendum)
HIGH-RISK PREGNANCY VISIT Patient name: Lori Donaldson MRN [id number]  Date of birth: September 09, 1985 Chief Complaint:   Routine Prenatal Visit  History of Present Illness:   Lori Donaldson is a 37 y.o. G79P2002 female at [redacted]w[redacted]d with an Estimated Date of Delivery: 02/16/23 being seen today for ongoing management of a high-risk pregnancy complicated by diabetes mellitus A1DM.    Today she reports  has been trying to get supplies to start checking sugars, but issue w/ insurance, and then pharmacy didn't have supplies, so going today to check .Isn't able to go to DM classes in [place] d/t childcare, etc.  Contractions: Not present.  .  Movement: Present. denies leaking of fluid.      08/11/2022   11:37 AM 04/20/2021    2:45 PM 03/01/2021   10:41 AM  Depression screen PHQ 2/9  Decreased Interest 1 0 0  Down, Depressed, Hopeless 0 0 0  PHQ - 2 Score 1 0 0  Altered sleeping 1 0 0  Tired, decreased energy 2 1 1   Change in appetite 0 0 0  Feeling bad or failure about yourself  1 1 0  Trouble concentrating 0 0 0  Moving slowly or fidgety/restless 0 0 0  Suicidal thoughts 0 0 0  PHQ-9 Score 5 2 1         08/11/2022   11:38 AM 04/20/2021    2:45 PM 03/01/2021   10:41 AM  GAD 7 : Generalized Anxiety Score  Nervous, Anxious, on Edge 0 1 0  Control/stop worrying 0 0 0  Worry too much - different things 0 0 0  Trouble relaxing 0 0 0  Restless 0 0 0  Easily annoyed or irritable 1 1 1   Afraid - awful might happen 0 0 0  Total GAD 7 Score 1 2 1      Review of Systems:   Pertinent items are noted in HPI Denies abnormal vaginal discharge w/ itching/odor/irritation, headaches, visual changes, shortness of breath, chest pain, abdominal pain, severe nausea/vomiting, or problems with urination or bowel movements unless otherwise stated above. Pertinent History Reviewed:  Reviewed past medical,surgical, social, obstetrical and family history.  Reviewed problem list, medications and  allergies. Physical Assessment:   Vitals:   12/11/22 1605  BP: 105/62  Pulse: 92  Weight: 223 lb (101.2 kg)  Body mass index is 38.28 kg/m.           Physical Examination:   General appearance: alert, well appearing, and in no distress  Mental status: alert, oriented to person, place, and time  Skin: warm & dry   Extremities: Edema: None    Cardiovascular: normal heart rate noted  Respiratory: normal respiratory effort, no distress  Abdomen: gravid, soft, non-tender  Pelvic: Cervical exam deferred         Fetal Status: Fetal Heart Rate (bpm): 150 Fundal Height: 30 cm Movement: Present    Fetal Surveillance Testing today: doppler   Chaperone: N/A    Results for orders placed or performed in visit on 12/11/22 (from the past 24 hour(s))  POC Urinalysis Dipstick OB   Collection Time: 12/11/22  4:27 PM  Result Value Ref Range   Color, UA     Clarity, UA     Glucose, UA Negative Negative   Bilirubin, UA     Ketones, UA negative    Spec Grav, UA     Blood, UA negative    pH, UA     POC,PROTEIN,UA Negative Negative, Trace, Small (1+),  Moderate (2+), Large (3+), 4+   Urobilinogen, UA     Nitrite, UA negative    Leukocytes, UA Trace (A) Negative   Appearance     Odor      Assessment & Plan:  High-risk pregnancy: [id number] at [redacted]w[redacted]d with an Estimated Date of Delivery: 02/16/23   1) A1DM, hasn't gotten supplies, going today to check on them. Let us know if still unable to get. Sent message to Tish to see if she can check on getting her scheduled in [place] for DM class. Reviewed basic decreasing carbs/refined sugars, increasing protein/veggies, checking sugars QID, and bringing log to each appt  Meds: No orders of the defined types were placed in this encounter.  Labs/procedures today: none  Treatment Plan:  [place] q4wks    No antenatal testing as long as remains off meds     Deliver @ 39-40wks:____   Reviewed: Preterm labor symptoms and general obstetric precautions including  but not limited to vaginal bleeding, contractions, leaking of fluid and fetal movement were reviewed in detail with the patient.  All questions were answered. Does have home bp cuff. Office bp cuff given: not applicable. Check bp weekly, let us know if consistently >140 and/or >90.  Follow-up: Return in about 2 weeks (around 12/25/2022) for HROB, US:EFW, MD or CNM, in person.   Future Appointments  Date Time Provider Department Center  12/25/2022  1:50 PM Cheral Marker, CNM [facility]    Orders Placed This Encounter  Procedures   POC Urinalysis Dipstick OB   Cheral Marker [place], [place] [facility] 12/11/2022 4:48 PM

## 2022-12-25 ENCOUNTER — Encounter: Payer: Commercial Managed Care - HMO | Admitting: Women's Health

## 2022-12-25 ENCOUNTER — Other Ambulatory Visit: Payer: Self-pay | Admitting: Women's Health

## 2022-12-25 DIAGNOSIS — O24410 Gestational diabetes mellitus in pregnancy, diet controlled: Secondary | ICD-10-CM

## 2022-12-26 ENCOUNTER — Ambulatory Visit (INDEPENDENT_AMBULATORY_CARE_PROVIDER_SITE_OTHER): Payer: Commercial Managed Care - HMO | Admitting: Obstetrics & Gynecology

## 2022-12-26 ENCOUNTER — Ambulatory Visit (INDEPENDENT_AMBULATORY_CARE_PROVIDER_SITE_OTHER): Payer: Commercial Managed Care - HMO

## 2022-12-26 ENCOUNTER — Encounter: Payer: Self-pay | Admitting: Obstetrics & Gynecology

## 2022-12-26 VITALS — BP 118/74 | HR 93 | Wt 218.0 lb

## 2022-12-26 DIAGNOSIS — O24410 Gestational diabetes mellitus in pregnancy, diet controlled: Secondary | ICD-10-CM

## 2022-12-26 DIAGNOSIS — Z3A32 32 weeks gestation of pregnancy: Secondary | ICD-10-CM

## 2022-12-26 DIAGNOSIS — O0990 Supervision of high risk pregnancy, unspecified, unspecified trimester: Secondary | ICD-10-CM

## 2022-12-26 DIAGNOSIS — O0993 Supervision of high risk pregnancy, unspecified, third trimester: Secondary | ICD-10-CM

## 2022-12-26 DIAGNOSIS — O09523 Supervision of elderly multigravida, third trimester: Secondary | ICD-10-CM

## 2022-12-26 NOTE — Progress Notes (Signed)
HIGH-RISK PREGNANCY VISIT Patient name: Lori Donaldson MRN [id number]  Date of birth: 01/28/1986 Chief Complaint:   Routine Prenatal Visit  History of Present Illness:   Lori Donaldson is a 37 y.o. G36P2002 female at [redacted]w[redacted]d with an Estimated Date of Delivery: 02/16/23 being seen today for ongoing management of a high-risk pregnancy complicated by A1DM-->fastings are suboptimal, 2 hours are good, will not add oral med just yet with 50% baby but keep an eye on it, pt is aware.    Today she reports no complaints. Contractions: Not present. Vag. Bleeding: None.  Movement: Present. denies leaking of fluid.      08/11/2022   11:37 AM 04/20/2021    2:45 PM 03/01/2021   10:41 AM  Depression screen PHQ 2/9  Decreased Interest 1 0 0  Down, Depressed, Hopeless 0 0 0  PHQ - 2 Score 1 0 0  Altered sleeping 1 0 0  Tired, decreased energy 2 1 1   Change in appetite 0 0 0  Feeling bad or failure about yourself  1 1 0  Trouble concentrating 0 0 0  Moving slowly or fidgety/restless 0 0 0  Suicidal thoughts 0 0 0  PHQ-9 Score 5 2 1         08/11/2022   11:38 AM 04/20/2021    2:45 PM 03/01/2021   10:41 AM  GAD 7 : Generalized Anxiety Score  Nervous, Anxious, on Edge 0 1 0  Control/stop worrying 0 0 0  Worry too much - different things 0 0 0  Trouble relaxing 0 0 0  Restless 0 0 0  Easily annoyed or irritable 1 1 1   Afraid - awful might happen 0 0 0  Total GAD 7 Score 1 2 1      Review of Systems:   Pertinent items are noted in HPI Denies abnormal vaginal discharge w/ itching/odor/irritation, headaches, visual changes, shortness of breath, chest pain, abdominal pain, severe nausea/vomiting, or problems with urination or bowel movements unless otherwise stated above. Pertinent History Reviewed:  Reviewed past medical,surgical, social, obstetrical and family history.  Reviewed problem list, medications and allergies. Physical Assessment:   Vitals:   12/26/22 1136  BP: 118/74   Pulse: 93  Weight: 218 lb (98.9 kg)  Body mass index is 37.42 kg/m.           Physical Examination:   General appearance: alert, well appearing, and in no distress  Mental status: alert, oriented to person, place, and time  Skin: warm & dry   Extremities:      Cardiovascular: normal heart rate noted  Respiratory: normal respiratory effort, no distress  Abdomen: gravid, soft, non-tender  Pelvic: Cervical exam deferred         Fetal Status:     Movement: Present    Fetal Surveillance Testing today: sonogram EFW50%   Chaperone:     No results found for this or any previous visit (from the past 24 hour(s)).  Assessment & Plan:  High-risk pregnancy: G3P2002 at [redacted]w[redacted]d with an Estimated Date of Delivery: 02/16/23      ICD-10-CM   1. Supervision of high risk pregnancy, antepartum  O09.90     2. Diet controlled gestational diabetes mellitus (GDM) in third trimester  O24.410        Meds: No orders of the defined types were placed in this encounter.   Orders: No orders of the defined types were placed in this encounter.    Labs/procedures today: U/S  Treatment Plan:  routine   Follow-up: Return in about 2 weeks (around 01/09/2023).   Future Appointments  Date Time Provider Department Center  01/09/2023  9:10 AM Lazaro Arms, MD [facility]  01/23/2023  3:00 PM [facility] - [facility] [place] [facility] None  01/23/2023  4:10 PM Myna Hidalgo, DO [facility]  02/02/2023  8:50 AM Myna Hidalgo, DO [facility]  02/09/2023  9:10 AM Lazaro Arms, MD [facility]  02/16/2023  8:50 AM Myna Hidalgo, DO [facility]    No orders of the defined types were placed in this encounter.  Lazaro Arms  Attending Physician for the Center for [facility] 12/26/2022 12:21 PM

## 2022-12-26 NOTE — Progress Notes (Signed)
[place] 32+4 wks,cephalic,posterior placenta gr 0,AFI 21 cm,FHR 144 bpm,EFW 2071 g 50%

## 2023-01-09 ENCOUNTER — Encounter: Payer: Self-pay | Admitting: Obstetrics & Gynecology

## 2023-01-09 ENCOUNTER — Ambulatory Visit (INDEPENDENT_AMBULATORY_CARE_PROVIDER_SITE_OTHER): Payer: Commercial Managed Care - HMO | Admitting: Obstetrics & Gynecology

## 2023-01-09 VITALS — BP 108/69 | HR 102 | Wt 218.0 lb

## 2023-01-09 DIAGNOSIS — O24410 Gestational diabetes mellitus in pregnancy, diet controlled: Secondary | ICD-10-CM

## 2023-01-09 DIAGNOSIS — O0990 Supervision of high risk pregnancy, unspecified, unspecified trimester: Secondary | ICD-10-CM

## 2023-01-09 DIAGNOSIS — Z3A34 34 weeks gestation of pregnancy: Secondary | ICD-10-CM

## 2023-01-09 DIAGNOSIS — O0993 Supervision of high risk pregnancy, unspecified, third trimester: Secondary | ICD-10-CM

## 2023-01-09 NOTE — Progress Notes (Signed)
HIGH-RISK PREGNANCY VISIT Patient name: Lori Donaldson MRN [id number]  Date of birth: May 18, 1986 Chief Complaint:   Routine Prenatal Visit  History of Present Illness:   Lori Donaldson is a 37 y.o. G51P2002 female at [redacted]w[redacted]d with an Estimated Date of Delivery: 02/16/23 being seen today for ongoing management of a high-risk pregnancy complicated by A1DM-->EFW 50%.    Today she reports no complaints. Contractions: Not present. Vag. Bleeding: None.  Movement: Present. denies leaking of fluid.      08/11/2022   11:37 AM 04/20/2021    2:45 PM 03/01/2021   10:41 AM  Depression screen PHQ 2/9  Decreased Interest 1 0 0  Down, Depressed, Hopeless 0 0 0  PHQ - 2 Score 1 0 0  Altered sleeping 1 0 0  Tired, decreased energy 2 1 1   Change in appetite 0 0 0  Feeling bad or failure about yourself  1 1 0  Trouble concentrating 0 0 0  Moving slowly or fidgety/restless 0 0 0  Suicidal thoughts 0 0 0  PHQ-9 Score 5 2 1         08/11/2022   11:38 AM 04/20/2021    2:45 PM 03/01/2021   10:41 AM  GAD 7 : Generalized Anxiety Score  Nervous, Anxious, on Edge 0 1 0  Control/stop worrying 0 0 0  Worry too much - different things 0 0 0  Trouble relaxing 0 0 0  Restless 0 0 0  Easily annoyed or irritable 1 1 1   Afraid - awful might happen 0 0 0  Total GAD 7 Score 1 2 1      Review of Systems:   Pertinent items are noted in HPI Denies abnormal vaginal discharge w/ itching/odor/irritation, headaches, visual changes, shortness of breath, chest pain, abdominal pain, severe nausea/vomiting, or problems with urination or bowel movements unless otherwise stated above. Pertinent History Reviewed:  Reviewed past medical,surgical, social, obstetrical and family history.  Reviewed problem list, medications and allergies. Physical Assessment:   Vitals:   01/09/23 0910  BP: 108/69  Pulse: (!) 102  Weight: 218 lb (98.9 kg)  Body mass index is 37.42 kg/m.           Physical Examination:    General appearance: alert, well appearing, and in no distress  Mental status: alert, oriented to person, place, and time  Skin: warm & dry   Extremities:      Cardiovascular: normal heart rate noted  Respiratory: normal respiratory effort, no distress  Abdomen: gravid, soft, non-tender  Pelvic: Cervical exam deferred         Fetal Status: Fetal Heart Rate (bpm): 140s Fundal Height: 34 cm Movement: Present    Fetal Surveillance Testing today:    Chaperone: N/A    No results found for this or any previous visit (from the past 24 hour(s)).  Assessment & Plan:  High-risk pregnancy: G3P2002 at [redacted]w[redacted]d with an Estimated Date of Delivery: 02/16/23      ICD-10-CM   1. Supervision of high risk pregnancy, antepartum  O09.90     2. Diet controlled gestational diabetes mellitus (GDM) in third trimester  O12.410       Fastings are borderline but with overall CBG will not add hs MTF at present, EFW also good   Meds: No orders of the defined types were placed in this encounter.   Orders: No orders of the defined types were placed in this encounter.    Labs/procedures today: none  Treatment Plan:  keep scheduled    Follow-up: Return for keep scheduled.   Future Appointments  Date Time Provider Department Center  01/23/2023  3:00 PM [facility] - [facility] [place] [facility] None  01/23/2023  4:10 PM Myna Hidalgo, DO [facility]  02/02/2023  8:50 AM Myna Hidalgo, DO [facility]  02/09/2023  9:10 AM Lazaro Arms, MD [facility]  02/16/2023  8:50 AM Myna Hidalgo, DO [facility]    No orders of the defined types were placed in this encounter.  Lazaro Arms  Attending Physician for the Center for [facility] 01/09/2023 9:37 AM

## 2023-01-22 ENCOUNTER — Other Ambulatory Visit: Payer: Self-pay | Admitting: Obstetrics & Gynecology

## 2023-01-22 DIAGNOSIS — O09523 Supervision of elderly multigravida, third trimester: Secondary | ICD-10-CM

## 2023-01-22 DIAGNOSIS — O24410 Gestational diabetes mellitus in pregnancy, diet controlled: Secondary | ICD-10-CM

## 2023-01-23 ENCOUNTER — Encounter: Payer: Self-pay | Admitting: Obstetrics & Gynecology

## 2023-01-23 ENCOUNTER — Ambulatory Visit: Payer: Commercial Managed Care - HMO

## 2023-01-23 ENCOUNTER — Other Ambulatory Visit (HOSPITAL_COMMUNITY)
Admission: RE | Admit: 2023-01-23 | Discharge: 2023-01-23 | Disposition: A | Discharge status: Home / Self Care | Payer: Commercial Managed Care - HMO | Source: Ambulatory Visit | Attending: Obstetrics & Gynecology | Admitting: Obstetrics & Gynecology

## 2023-01-23 ENCOUNTER — Ambulatory Visit (INDEPENDENT_AMBULATORY_CARE_PROVIDER_SITE_OTHER): Payer: Commercial Managed Care - HMO | Admitting: Obstetrics & Gynecology

## 2023-01-23 VITALS — BP 114/75 | HR 88 | Wt 217.0 lb

## 2023-01-23 DIAGNOSIS — O0993 Supervision of high risk pregnancy, unspecified, third trimester: Secondary | ICD-10-CM | POA: Diagnosis present

## 2023-01-23 DIAGNOSIS — O24415 Gestational diabetes mellitus in pregnancy, controlled by oral hypoglycemic drugs: Secondary | ICD-10-CM

## 2023-01-23 DIAGNOSIS — Z3A36 36 weeks gestation of pregnancy: Secondary | ICD-10-CM

## 2023-01-23 DIAGNOSIS — O09523 Supervision of elderly multigravida, third trimester: Secondary | ICD-10-CM

## 2023-01-23 DIAGNOSIS — O24410 Gestational diabetes mellitus in pregnancy, diet controlled: Secondary | ICD-10-CM

## 2023-01-23 DIAGNOSIS — O0990 Supervision of high risk pregnancy, unspecified, unspecified trimester: Secondary | ICD-10-CM

## 2023-01-23 MED ORDER — METFORMIN HCL 500 MG PO TABS
500.0000 mg | ORAL_TABLET | Freq: Every evening | ORAL | 0 refills | Status: DC
Start: 2023-01-23 — End: 2023-02-10

## 2023-01-23 NOTE — Progress Notes (Signed)
HIGH-RISK PREGNANCY VISIT Patient name: Lori Donaldson MRN [id number]  Date of birth: 1986/06/19 Chief Complaint:   Routine Prenatal Visit  History of Present Illness:   Lori Donaldson is a 37 y.o. G91P2002 female at [redacted]w[redacted]d with an Estimated Date of Delivery: 02/16/23 being seen today for ongoing management of a high-risk pregnancy complicated by:  GDMA1 Elevated fastings noted, otherwise wnl  Today she reports  pelvic pain/pressure .   Contractions: Not present. Vag. Bleeding: None.  Movement: Present. denies leaking of fluid.      08/11/2022   11:37 AM 04/20/2021    2:45 PM 03/01/2021   10:41 AM  Depression screen PHQ 2/9  Decreased Interest 1 0 0  Down, Depressed, Hopeless 0 0 0  PHQ - 2 Score 1 0 0  Altered sleeping 1 0 0  Tired, decreased energy 2 1 1   Change in appetite 0 0 0  Feeling bad or failure about yourself  1 1 0  Trouble concentrating 0 0 0  Moving slowly or fidgety/restless 0 0 0  Suicidal thoughts 0 0 0  PHQ-9 Score 5 2 1      Current Outpatient Medications  Medication Instructions   Accu-Chek Softclix Lancets lancets Use as instructed to check blood sugar 4 times daily   aspirin 162 mg, Oral, Daily   Blood Glucose Monitoring Suppl (ACCU-CHEK GUIDE ME) w/Device KIT 1 each, Does not apply, 4 times daily   cetirizine (ZYRTEC) 10 mg, Oral, Daily   famotidine (PEPCID) 20 mg, Oral, 2 times daily   glucose blood (ACCU-CHEK GUIDE) test strip Use as instructed to check blood sugar 4 times daily   Prenatal Vit-Fe Fumarate-FA (PRENATAL MULTIVITAMIN) TABS tablet 1 tablet, Oral, Daily     Review of Systems:   Pertinent items are noted in HPI Denies abnormal vaginal discharge w/ itching/odor/irritation, headaches, visual changes, shortness of breath, chest pain, abdominal pain, severe nausea/vomiting, or problems with urination or bowel movements unless otherwise stated above. Pertinent History Reviewed:  Reviewed past medical,surgical, social, obstetrical  and family history.  Reviewed problem list, medications and allergies. Physical Assessment:   Vitals:   01/23/23 1534  BP: 114/75  Pulse: 88  Weight: 217 lb (98.4 kg)  Body mass index is 37.25 kg/m.           Physical Examination:   General appearance: alert, well appearing, and in no distress  Mental status: normal mood, behavior, speech, dress, motor activity, and thought processes  Skin: warm & dry   Extremities:      Cardiovascular: normal heart rate noted  Respiratory: normal respiratory effort, no distress  Abdomen: gravid, soft, non-tender  Pelvic: Cervical exam performed  Dilation: 1 Effacement (%): 20 Station: -3  Fetal Status:     Movement: Present Presentation: Vertex  Fetal Surveillance Testing today: cephalic,posterior placenta gr 3,AFI 16 cm,FHR 138 bpm,EFW 2854 g 42%    Chaperone: Faith Rogue    No results found for this or any previous visit (from the past 24 hour(s)).   Assessment & Plan:  High-risk pregnancy: G3P2002 at [redacted]w[redacted]d with an Estimated Date of Delivery: 02/16/23   1) RJJO8>C1 -discussed pros/cons of starting metformin, plan to start one tablet at night -growth scan today, AGA -scheduled for IOL @ 39wk- 8/2 MN  -GBS, GC/C collected  Meds: No orders of the defined types were placed in this encounter.   Labs/procedures today: GBS, GC/C  Treatment Plan:  routine OB care  Reviewed: Preterm labor symptoms and general obstetric precautions  including but not limited to vaginal bleeding, contractions, leaking of fluid and fetal movement were reviewed in detail with the patient.  All questions were answered. Pt has home bp cuff. Check bp weekly, let us know if >140/90.   Follow-up: Return for 1wk OB visit as scheduled.   Future Appointments  Date Time Provider Department Center  01/23/2023  4:10 PM Myna Hidalgo, DO [facility]  02/02/2023  8:50 AM Myna Hidalgo, DO [facility]  02/09/2023  9:10 AM Lazaro Arms, MD [facility]   02/16/2023  8:50 AM Myna Hidalgo, DO [facility]    Orders Placed This Encounter  Procedures   Culture, beta strep (group b only)    Myna Hidalgo, DO Attending Obstetrician & Gynecologist, [facility] for [employer], [facility]

## 2023-01-23 NOTE — Progress Notes (Signed)
[place] 36+4 wks,cephalic,posterior placenta gr 3,AFI 16 cm,FHR 138 bpm,EFW 2854 g 42%

## 2023-01-25 LAB — CERVICOVAGINAL ANCILLARY ONLY
Chlamydia: NEGATIVE
Comment: NEGATIVE
Comment: NORMAL
Neisseria Gonorrhea: NEGATIVE

## 2023-01-27 LAB — CULTURE, BETA STREP (GROUP B ONLY): Strep Gp B Culture: NEGATIVE

## 2023-02-02 ENCOUNTER — Telehealth (HOSPITAL_COMMUNITY): Payer: Self-pay | Admitting: *Deleted

## 2023-02-02 ENCOUNTER — Ambulatory Visit (INDEPENDENT_AMBULATORY_CARE_PROVIDER_SITE_OTHER): Payer: Commercial Managed Care - HMO | Admitting: Obstetrics & Gynecology

## 2023-02-02 ENCOUNTER — Encounter: Payer: Self-pay | Admitting: Obstetrics & Gynecology

## 2023-02-02 ENCOUNTER — Encounter (HOSPITAL_COMMUNITY): Payer: Self-pay

## 2023-02-02 VITALS — BP 116/77 | HR 83 | Wt 221.2 lb

## 2023-02-02 DIAGNOSIS — O24415 Gestational diabetes mellitus in pregnancy, controlled by oral hypoglycemic drugs: Secondary | ICD-10-CM

## 2023-02-02 DIAGNOSIS — O0993 Supervision of high risk pregnancy, unspecified, third trimester: Secondary | ICD-10-CM

## 2023-02-02 DIAGNOSIS — Z3A38 38 weeks gestation of pregnancy: Secondary | ICD-10-CM

## 2023-02-02 NOTE — Telephone Encounter (Signed)
Preadmission screen  

## 2023-02-02 NOTE — Progress Notes (Signed)
HIGH-RISK PREGNANCY VISIT Patient name: Lori Donaldson MRN [id number]  Date of birth: [birth year] Chief Complaint:   Routine Prenatal Visit  History of Present Illness:   Lori Donaldson is a 37 y.o. G65P2002 female at [redacted]w[redacted]d with an Estimated Date of Delivery: 02/16/23 being seen today for ongoing management of a high-risk pregnancy complicated by:  -GDMA2 Doing better with metformin  Today she reports  pelvic pressure .   Contractions: Irregular. Vag. Bleeding: None.  Movement: Present. denies leaking of fluid.      08/11/2022   11:37 AM 04/20/2021    2:45 PM 03/01/2021   10:41 AM  Depression screen PHQ 2/9  Decreased Interest 1 0 0  Down, Depressed, Hopeless 0 0 0  PHQ - 2 Score 1 0 0  Altered sleeping 1 0 0  Tired, decreased energy 2 1 1   Change in appetite 0 0 0  Feeling bad or failure about yourself  1 1 0  Trouble concentrating 0 0 0  Moving slowly or fidgety/restless 0 0 0  Suicidal thoughts 0 0 0  PHQ-9 Score 5 2 1      Current Outpatient Medications  Medication Instructions   Accu-Chek Softclix Lancets lancets Use as instructed to check blood sugar 4 times daily   aspirin 162 mg, Oral, Daily   Blood Glucose Monitoring Suppl (ACCU-CHEK GUIDE ME) w/Device KIT 1 each, Does not apply, 4 times daily   cetirizine (ZYRTEC) 10 mg, Oral, Daily   famotidine (PEPCID) 20 mg, Oral, 2 times daily   glucose blood (ACCU-CHEK GUIDE) test strip Use as instructed to check blood sugar 4 times daily   metFORMIN (GLUCOPHAGE) 500 mg, Oral, Nightly   Prenatal Vit-Fe Fumarate-FA (PRENATAL MULTIVITAMIN) TABS tablet 1 tablet, Oral, Daily     Review of Systems:   Pertinent items are noted in HPI Denies abnormal vaginal discharge w/ itching/odor/irritation, headaches, visual changes, shortness of breath, chest pain, abdominal pain, severe nausea/vomiting, or problems with urination or bowel movements unless otherwise stated above. Pertinent History Reviewed:  Reviewed past  medical,surgical, social, obstetrical and family history.  Reviewed problem list, medications and allergies. Physical Assessment:   Vitals:   02/02/23 0855  BP: 116/77  Pulse: 83  Weight: 221 lb 3.2 oz (100.3 kg)  Body mass index is 37.97 kg/m.           Physical Examination:   General appearance: alert, well appearing, and in no distress  Mental status: normal mood, behavior, speech, dress, motor activity, and thought processes  Skin: warm & dry   Extremities: Edema: None    Cardiovascular: normal heart rate noted  Respiratory: normal respiratory effort, no distress  Abdomen: gravid, soft, non-tender  Pelvic: Cervical exam deferred  Dilation: 2 Effacement (%): 50 Station: -2  Fetal Status: Fetal Heart Rate (bpm): 140 Fundal Height: 37 cm Movement: Present    Fetal Surveillance Testing today: doppler   Chaperone:  pt declined     No results found for this or any previous visit (from the past 24 hour(s)).   Assessment & Plan:  High-risk pregnancy: G3P2002 at [redacted]w[redacted]d with an Estimated Date of Delivery: 02/16/23   1) GDMA2 -continue with current regimen -scheduled for IOL next week  2) Anxiety No meds, plan to readdress postpartum  Meds: No orders of the defined types were placed in this encounter.   Labs/procedures today: none  Treatment Plan:  routine OB care, IOL scheduled for Friday- orders placed  Reviewed: Term labor symptoms and general obstetric precautions  including but not limited to vaginal bleeding, contractions, leaking of fluid and fetal movement were reviewed in detail with the patient.  All questions were answered. Pt has home bp cuff. Check bp weekly, let us know if >140/90.   Follow-up: Return for 5-6wk postpartum visit.   Future Appointments  Date Time Provider Department Center  02/09/2023 12:00 AM [facility] SCHED ROOM [facility] None    No orders of the defined types were placed in this encounter.   Myna Hidalgo, DO Attending Obstetrician &  Gynecologist, [facility] for [employer], [facility]

## 2023-02-03 ENCOUNTER — Other Ambulatory Visit: Payer: Self-pay | Admitting: Advanced Practice Midwife

## 2023-02-05 ENCOUNTER — Encounter (HOSPITAL_COMMUNITY): Payer: Self-pay | Admitting: *Deleted

## 2023-02-05 ENCOUNTER — Telehealth (HOSPITAL_COMMUNITY): Payer: Self-pay | Admitting: *Deleted

## 2023-02-05 NOTE — Telephone Encounter (Signed)
Preadmission screen  

## 2023-02-09 ENCOUNTER — Inpatient Hospital Stay (HOSPITAL_COMMUNITY): Payer: Commercial Managed Care - HMO

## 2023-02-09 ENCOUNTER — Other Ambulatory Visit: Payer: Self-pay

## 2023-02-09 ENCOUNTER — Inpatient Hospital Stay (HOSPITAL_COMMUNITY)
Admission: RE | Admit: 2023-02-09 | Discharge: 2023-02-10 | Disposition: A | Discharge status: Home / Self Care | Payer: Commercial Managed Care - HMO | Source: Home / Self Care | Attending: Obstetrics & Gynecology | Admitting: Obstetrics & Gynecology

## 2023-02-09 ENCOUNTER — Inpatient Hospital Stay (HOSPITAL_COMMUNITY): Payer: Commercial Managed Care - HMO | Admitting: Anesthesiology

## 2023-02-09 ENCOUNTER — Encounter: Payer: Commercial Managed Care - HMO | Admitting: Obstetrics & Gynecology

## 2023-02-09 ENCOUNTER — Encounter (HOSPITAL_COMMUNITY): Payer: Self-pay | Admitting: Obstetrics & Gynecology

## 2023-02-09 DIAGNOSIS — O24425 Gestational diabetes mellitus in childbirth, controlled by oral hypoglycemic drugs: Secondary | ICD-10-CM | POA: Diagnosis present

## 2023-02-09 DIAGNOSIS — Z23 Encounter for immunization: Secondary | ICD-10-CM

## 2023-02-09 DIAGNOSIS — O09522 Supervision of elderly multigravida, second trimester: Secondary | ICD-10-CM

## 2023-02-09 DIAGNOSIS — Z87891 Personal history of nicotine dependence: Secondary | ICD-10-CM

## 2023-02-09 DIAGNOSIS — O0990 Supervision of high risk pregnancy, unspecified, unspecified trimester: Secondary | ICD-10-CM

## 2023-02-09 DIAGNOSIS — Z8632 Personal history of gestational diabetes: Secondary | ICD-10-CM | POA: Diagnosis present

## 2023-02-09 DIAGNOSIS — O09529 Supervision of elderly multigravida, unspecified trimester: Secondary | ICD-10-CM

## 2023-02-09 DIAGNOSIS — O09523 Supervision of elderly multigravida, third trimester: Secondary | ICD-10-CM

## 2023-02-09 DIAGNOSIS — Z3A39 39 weeks gestation of pregnancy: Secondary | ICD-10-CM

## 2023-02-09 DIAGNOSIS — O24424 Gestational diabetes mellitus in childbirth, insulin controlled: Secondary | ICD-10-CM

## 2023-02-09 DIAGNOSIS — O24419 Gestational diabetes mellitus in pregnancy, unspecified control: Secondary | ICD-10-CM | POA: Diagnosis present

## 2023-02-09 LAB — TYPE AND SCREEN
ABO/RH(D): B POS
Antibody Screen: NEGATIVE

## 2023-02-09 LAB — CBC
HCT: 33.2 % — ABNORMAL LOW (ref 36.0–46.0)
Hemoglobin: 10.7 g/dL — ABNORMAL LOW (ref 12.0–15.0)
MCH: 27.8 pg (ref 26.0–34.0)
MCHC: 32.2 g/dL (ref 30.0–36.0)
MCV: 86.2 fL (ref 80.0–100.0)
Platelets: 259 10*3/uL (ref 150–400)
RBC: 3.85 MIL/uL — ABNORMAL LOW (ref 3.87–5.11)
RDW: 13.3 % (ref 11.5–15.5)
WBC: 8.8 10*3/uL (ref 4.0–10.5)
nRBC: 0 % (ref 0.0–0.2)

## 2023-02-09 LAB — RPR: RPR Ser Ql: NONREACTIVE

## 2023-02-09 LAB — HIV ANTIBODY (ROUTINE TESTING W REFLEX): HIV Screen 4th Generation wRfx: NONREACTIVE

## 2023-02-09 LAB — GLUCOSE, CAPILLARY
Glucose-Capillary: 100 mg/dL — ABNORMAL HIGH (ref 70–99)
Glucose-Capillary: 104 mg/dL — ABNORMAL HIGH (ref 70–99)
Glucose-Capillary: 115 mg/dL — ABNORMAL HIGH (ref 70–99)

## 2023-02-09 MED ORDER — OXYCODONE HCL 5 MG PO TABS: 5.0000 mg | ORAL_TABLET | ORAL | Status: DC | PRN

## 2023-02-09 MED ORDER — OXYTOCIN-SODIUM CHLORIDE 30-0.9 UT/500ML-% IV SOLN
1.0000 m[IU]/min | INTRAVENOUS | Status: DC
  Administered 2023-02-09: 2 m[IU]/min via INTRAVENOUS
  Filled 2023-02-09: qty 500

## 2023-02-09 MED ORDER — ONDANSETRON HCL 4 MG PO TABS: 4.0000 mg | ORAL_TABLET | ORAL | Status: DC | PRN

## 2023-02-09 MED ORDER — OXYTOCIN BOLUS FROM INFUSION
333.0000 mL | Freq: Once | INTRAVENOUS | Status: AC
  Administered 2023-02-09: 333 mL via INTRAVENOUS

## 2023-02-09 MED ORDER — DIPHENHYDRAMINE HCL 50 MG/ML IJ SOLN: 12.5000 mg | INTRAMUSCULAR | Status: DC | PRN

## 2023-02-09 MED ORDER — TERBUTALINE SULFATE 1 MG/ML IJ SOLN: 0.2500 mg | Freq: Once | INTRAMUSCULAR | Status: DC | PRN

## 2023-02-09 MED ORDER — LIDOCAINE HCL (PF) 1 % IJ SOLN
INTRAMUSCULAR | Status: DC | PRN
  Administered 2023-02-09: 10 mL via EPIDURAL
  Administered 2023-02-09: 2 mL via EPIDURAL

## 2023-02-09 MED ORDER — BENZOCAINE-MENTHOL 20-0.5 % EX AERO: 1.0000 | INHALATION_SPRAY | CUTANEOUS | Status: DC | PRN

## 2023-02-09 MED ORDER — FENTANYL-BUPIVACAINE-NACL 0.5-0.125-0.9 MG/250ML-% EP SOLN
EPIDURAL | Status: DC | PRN
  Administered 2023-02-09: 12 mL/h via EPIDURAL

## 2023-02-09 MED ORDER — LACTATED RINGERS IV SOLN: 500.0000 mL | INTRAVENOUS | Status: DC | PRN

## 2023-02-09 MED ORDER — ONDANSETRON HCL 4 MG/2ML IJ SOLN: 4.0000 mg | INTRAMUSCULAR | Status: DC | PRN

## 2023-02-09 MED ORDER — OXYTOCIN-SODIUM CHLORIDE 30-0.9 UT/500ML-% IV SOLN: 2.5000 [IU]/h | INTRAVENOUS | Status: DC

## 2023-02-09 MED ORDER — PHENYLEPHRINE 80 MCG/ML (10ML) SYRINGE FOR IV PUSH (FOR BLOOD PRESSURE SUPPORT): 80.0000 ug | PREFILLED_SYRINGE | INTRAVENOUS | Status: DC | PRN

## 2023-02-09 MED ORDER — FENTANYL CITRATE (PF) 100 MCG/2ML IJ SOLN: 50.0000 ug | INTRAMUSCULAR | Status: DC | PRN

## 2023-02-09 MED ORDER — OXYCODONE-ACETAMINOPHEN 5-325 MG PO TABS: 1.0000 | ORAL_TABLET | ORAL | Status: DC | PRN

## 2023-02-09 MED ORDER — ZOLPIDEM TARTRATE 5 MG PO TABS: 5.0000 mg | ORAL_TABLET | Freq: Every evening | ORAL | Status: DC | PRN

## 2023-02-09 MED ORDER — DIBUCAINE (PERIANAL) 1 % EX OINT: 1.0000 | TOPICAL_OINTMENT | CUTANEOUS | Status: DC | PRN

## 2023-02-09 MED ORDER — COCONUT OIL OIL: 1.0000 | TOPICAL_OIL | Status: DC | PRN

## 2023-02-09 MED ORDER — DIPHENHYDRAMINE HCL 25 MG PO CAPS: 25.0000 mg | ORAL_CAPSULE | Freq: Four times a day (QID) | ORAL | Status: DC | PRN

## 2023-02-09 MED ORDER — EPHEDRINE 5 MG/ML INJ: 10.0000 mg | INTRAVENOUS | Status: DC | PRN

## 2023-02-09 MED ORDER — SIMETHICONE 80 MG PO CHEW: 80.0000 mg | CHEWABLE_TABLET | ORAL | Status: DC | PRN

## 2023-02-09 MED ORDER — LACTATED RINGERS IV SOLN: INTRAVENOUS | Status: DC

## 2023-02-09 MED ORDER — ACETAMINOPHEN 325 MG PO TABS: 650.0000 mg | ORAL_TABLET | ORAL | Status: DC | PRN

## 2023-02-09 MED ORDER — LACTATED RINGERS IV SOLN: 500.0000 mL | Freq: Once | INTRAVENOUS | Status: DC

## 2023-02-09 MED ORDER — LIDOCAINE HCL (PF) 1 % IJ SOLN: 30.0000 mL | INTRAMUSCULAR | Status: DC | PRN

## 2023-02-09 MED ORDER — OXYCODONE-ACETAMINOPHEN 5-325 MG PO TABS: 2.0000 | ORAL_TABLET | ORAL | Status: DC | PRN

## 2023-02-09 MED ORDER — PHENYLEPHRINE 80 MCG/ML (10ML) SYRINGE FOR IV PUSH (FOR BLOOD PRESSURE SUPPORT)
80.0000 ug | PREFILLED_SYRINGE | INTRAVENOUS | Status: DC | PRN
  Filled 2023-02-09: qty 10

## 2023-02-09 MED ORDER — WITCH HAZEL-GLYCERIN EX PADS: 1.0000 | MEDICATED_PAD | CUTANEOUS | Status: DC | PRN

## 2023-02-09 MED ORDER — FENTANYL-BUPIVACAINE-NACL 0.5-0.125-0.9 MG/250ML-% EP SOLN
12.0000 mL/h | EPIDURAL | Status: DC | PRN
  Filled 2023-02-09: qty 250

## 2023-02-09 MED ORDER — ONDANSETRON HCL 4 MG/2ML IJ SOLN: 4.0000 mg | Freq: Four times a day (QID) | INTRAMUSCULAR | Status: DC | PRN

## 2023-02-09 MED ORDER — SENNOSIDES-DOCUSATE SODIUM 8.6-50 MG PO TABS
2.0000 | ORAL_TABLET | ORAL | Status: DC
  Administered 2023-02-09 – 2023-02-10 (×2): 2 via ORAL
  Filled 2023-02-09 (×2): qty 2

## 2023-02-09 MED ORDER — PRENATAL MULTIVITAMIN CH
1.0000 | ORAL_TABLET | Freq: Every day | ORAL | Status: DC
  Administered 2023-02-09 – 2023-02-10 (×2): 1 via ORAL
  Filled 2023-02-09 (×2): qty 1

## 2023-02-09 MED ORDER — IBUPROFEN 600 MG PO TABS
600.0000 mg | ORAL_TABLET | Freq: Four times a day (QID) | ORAL | Status: DC
  Administered 2023-02-09 – 2023-02-10 (×4): 600 mg via ORAL
  Filled 2023-02-09 (×4): qty 1

## 2023-02-09 MED ORDER — SOD CITRATE-CITRIC ACID 500-334 MG/5ML PO SOLN: 30.0000 mL | ORAL | Status: DC | PRN

## 2023-02-09 MED ORDER — TETANUS-DIPHTH-ACELL PERTUSSIS 5-2.5-18.5 LF-MCG/0.5 IM SUSY
0.5000 mL | PREFILLED_SYRINGE | Freq: Once | INTRAMUSCULAR | Status: AC
  Administered 2023-02-10: 0.5 mL via INTRAMUSCULAR
  Filled 2023-02-09: qty 0.5

## 2023-02-09 NOTE — Progress Notes (Signed)
OB/GYN Faculty Practice: Labor Progress Note  Subjective: Pt resting comfortably  Objective: BP 117/69   Pulse 90   Temp 98.1 F (36.7 C) (Oral)   Resp 16   Ht 5\' 4"  (1.626 m)   Wt 100.8 kg   LMP  (LMP Unknown)   SpO2 99%   BMI 38.14 kg/m  Gen: no acute distress Dilation: 4 Effacement (%): 60 Station: -1 Presentation: Vertex Exam by:: Dr. Charlotta Newton AROM- blood-tinged  Assessment and Plan: 37 y.o. [id number] [redacted]w[redacted]d for IOL due to GDMA2  Labor: continue Pit per protocol -- pain control: continue epidural  Fetal Well-Being:  -- Category I - continuous fetal monitoring  - Last [place] @ [redacted]w[redacted]d- EFW: 2854g (42%) -- GBS (neg)   Katha Hamming, DO 9:01 AM

## 2023-02-09 NOTE — H&P (Signed)
OBSTETRIC ADMISSION HISTORY AND PHYSICAL  Lori Donaldson is a 37 y.o. female G3P2002 with IUP at [redacted]w[redacted]d by 9 week [place] presenting for scheduled IOL for A2GDM, well controlled on metformin. She reports +FMs, No LOF, no VB, no blurry vision, headaches or peripheral edema, and RUQ pain.  She plans on breast and bottle  feeding. She request a mirena for birth control. She received her prenatal care at [facility]   Dating: By 9 week [place] --->  Estimated Date of Delivery: 02/16/23  Sono:    @[redacted]w[redacted]d , CWD, normal anatomy, cephalic presentation, 2854g, 13% EFW   Prenatal History/Complications:  A2GDM - well controlled on metformin AMA     FAMILY TREE   RESULTS  Language English Pap 11/11/21 NILM, -HRHPV  Initiated care at 13wks GC/CT Initial:  -/-          36wks:  Dating by 02/16/2023, by Ultrasound      Support person Ree Edman [facility] NT/IT: declined    AFP:  declined    Panorama: declined  BP cuff Has bp cuff Carrier Screen declined      [place]/Hgb Elec 04/20/21 neg  Rhogam N/a      TDaP vaccine   Blood Type B/Positive/-- (02/02 1213)  Flu vaccine   Antibody Negative (02/02 1213)  Covid vaccine   HBsAg Negative (02/02 1213)      RPR Non Reactive (02/02 1213)  Anatomy US Nl boy Rubella  1.08 (02/02 1213)  Feeding Plan both HIV Non Reactive (02/02 1213)  Contraception pp Mirena Hep C neg  Circumcision yes      Pediatrician [facility] in [place] A1C/GTT Early:      26-28wks: abnormal  Prenatal Classes            GBS     [ ]  PCN allergy  BTL Consent        VBAC Consent   PHQ9 & GAD7  [ ] New OB  [ ] 28wks   [ ] 36wks  Waterbirth [ ] Class [ ]  36wkCNM visit/consent         Past Medical History: Past Medical History:  Diagnosis Date   Anxiety    has therapist   Asthma    when young   Cholecystitis 2016   Cholelithiases 2016   Gestational diabetes    Hx of cholecystectomy 03/16/2015   Ovarian cyst rupture 2018   Plantar fasciitis    Vaginal Pap smear, abnormal    HPV noted, 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    Past Surgical History: Past Surgical History:  Procedure Laterality Date   CERVICAL BIOPSY  W/ LOOP ELECTRODE EXCISION     CHOLECYSTECTOMY N/A 03/16/2015   Procedure: LAPAROSCOPIC CONVERTED TO OPEN CHOLECYSTECTOMY;  Surgeon: Claud Kelp, MD;  Location: [facility];  Service: General;  Laterality: N/A;    Obstetrical History: OB History     Gravida  3   Para  2   Term  2   Preterm      AB      Living  2      SAB      IAB      Ectopic      Multiple  0   Live Births  2           Social History Social History   Socioeconomic History   Marital status: Married    Spouse name: Janyth Pupa   Number of children: 2   Years of education: Not on file   Highest education  level: Not on file  Occupational History   Not on file  Tobacco Use   Smoking status: Former    Types: Cigarettes   Smokeless tobacco: Never   Tobacco comments:    rare, on occasion social  Vaping Use   Vaping status: Never Used  Substance and Sexual Activity   Alcohol use: Not Currently   Drug use: No   Sexual activity: Yes    Birth control/protection: None  Other Topics Concern   Not on file  Social History Narrative   Not on file   Social Determinants of Health   Financial Resource Strain: Low Risk  (08/11/2022)   Overall Financial Resource Strain (CARDIA)    Difficulty of Paying Living Expenses: Not hard at all  Food Insecurity: No Food Insecurity (02/09/2023)   Hunger Vital Sign    Worried About Running Out of Food in the Last Year: Never true    Ran Out of Food in the Last Year: Never true  Transportation Needs: No Transportation Needs (02/09/2023)   PRAPARE - Administrator, Civil Service (Medical): No    Lack of Transportation (Non-Medical): No  Physical Activity: Insufficiently Active (08/11/2022)   Exercise Vital Sign    Days of Exercise per Week: 1 day    Minutes of Exercise per Session: 40 min  Stress: No Stress Concern Present (08/11/2022)   [employer] of Occupational Health - Occupational Stress Questionnaire    Feeling of Stress : Only a little  Social Connections: Moderately Integrated (08/11/2022)   Social Connection and Isolation Panel [NHANES]    Frequency of Communication with Friends and Family: Twice a week    Frequency of Social Gatherings with Friends and Family: Once a week    Attends Religious Services: 1 to 4 times per year    Active Member of [employer] or Organizations: No    Attends Engineer, structural: Never    Marital Status: Married    Family History: Family History  Problem Relation Age of Onset   Diabetes Maternal Grandmother    Arthritis Maternal Grandmother    Heart disease Maternal Grandmother    Cataracts Maternal Grandmother    Multiple sclerosis Maternal Grandfather    Hyperlipidemia Father    Diabetes Maternal Uncle     Allergies: No Known Allergies  Medications Prior to Admission  Medication Sig Dispense Refill Last Dose   Accu-Chek Softclix Lancets lancets Use as instructed to check blood sugar 4 times daily (Patient not taking: Reported on 12/11/2022) 100 each 12    aspirin 81 MG chewable tablet Chew 2 tablets (162 mg total) by mouth daily. 60 tablet 7    Blood Glucose Monitoring Suppl (ACCU-CHEK GUIDE ME) w/Device KIT 1 each by Does not apply route 4 (four) times daily. (Patient not taking: Reported on 12/11/2022) 1 kit 0    cetirizine (ZYRTEC) 10 MG tablet Take 10 mg by mouth daily.      famotidine (PEPCID) 20 MG tablet Take 1 tablet (20 mg total) by mouth 2 (two) times daily. 60 tablet 11    glucose blood (ACCU-CHEK GUIDE) test strip Use as instructed to check blood sugar 4 times daily (Patient not taking: Reported on 01/09/2023) 50 each 12    metFORMIN (GLUCOPHAGE) 500 MG tablet Take 1 tablet (500 mg total) by mouth at bedtime. 30 tablet 0    Prenatal Vit-Fe Fumarate-FA (PRENATAL MULTIVITAMIN) TABS tablet Take 1 tablet by mouth daily at 12 noon.  Review of Systems   All  systems reviewed and negative except as stated in HPI  Blood pressure 119/76, pulse 84, temperature 97.9 F (36.6 C), temperature source Oral, resp. rate 16, height 5\' 4"  (1.626 m), weight 100.8 kg, SpO2 99%, not currently breastfeeding. General appearance: alert, cooperative, and appears stated age Lungs: clear to auscultation bilaterally Heart: regular rate and rhythm Abdomen: soft, non-tender; bowel sounds normal Pelvic: see below Extremities: Homans sign is negative, no sign of DVT Presentation: cephalic  Fetal monitoring: 135 bpm, moderate variability, + accelerations, no decelerations  Uterine activity: contractions every 1-3 mins  Dilation: 3.5 Effacement (%): 80 Station: -1 Exam by:: Ulis Rias, RN   Prenatal labs: ABO, Rh: --/--/B POS (08/02 0203) Antibody: NEG (08/02 0203) Rubella: 1.08 (02/02 1213) RPR: Non Reactive (05/17 0834)  HBsAg: Negative (02/02 1213)  HIV: Non Reactive (08/02 0203)  GBS: Negative/-- (07/16 1720)  2 hr Glucola abnormal Genetic screening - declined Anatomy US normal  Prenatal Transfer Tool  Maternal Diabetes: Yes:  Diabetes Type:  Insulin/Medication controlled Genetic Screening: Declined Maternal Ultrasounds/Referrals: Normal Fetal Ultrasounds or other Referrals:  None Maternal Substance Abuse:  No Significant Maternal Medications:  metformin Significant Maternal Lab Results:  Group B Strep negative Number of Prenatal Visits:greater than 3 verified prenatal visits Other Comments:  None  Results for orders placed or performed during the hospital encounter of 02/09/23 (from the past 24 hour(s))  CBC   Collection Time: 02/09/23  2:03 AM  Result Value Ref Range   WBC 8.8 4.0 - 10.5 K/uL   RBC 3.85 (L) 3.87 - 5.11 MIL/uL   Hemoglobin 10.7 (L) 12.0 - 15.0 g/dL   HCT 40.9 (L) 81.1 - 91.4 %   MCV 86.2 80.0 - 100.0 fL   MCH 27.8 26.0 - 34.0 pg   MCHC 32.2 30.0 - 36.0 g/dL   RDW 78.2 95.6 - 21.3 %   Platelets 259 150 - 400 K/uL   nRBC  0.0 0.0 - 0.2 %  HIV Antibody (routine testing w rflx)   Collection Time: 02/09/23  2:03 AM  Result Value Ref Range   HIV Screen 4th Generation wRfx Non Reactive Non Reactive  Type and screen [facility]   Collection Time: 02/09/23  2:03 AM  Result Value Ref Range   ABO/RH(D) B POS    Antibody Screen NEG    Sample Expiration      02/12/2023,2359 Performed at [facility], [address], [place], [place] [address]   Glucose, capillary   Collection Time: 02/09/23  3:10 AM  Result Value Ref Range   Glucose-Capillary 100 (H) 70 - 99 mg/dL  Glucose, capillary   Collection Time: 02/09/23  6:19 AM  Result Value Ref Range   Glucose-Capillary 104 (H) 70 - 99 mg/dL    Patient Active Problem List   Diagnosis Date Noted   Gestational diabetes 11/26/2022   Supervision of high risk pregnancy, antepartum 08/11/2022   AMA (advanced maternal age) multigravida 35+ 08/11/2022   Anxiety 04/20/2021    Assessment/Plan:  Analeigh Aries is a 37 y.o. G3P2002 at [redacted]w[redacted]d here for IOL for A2GDM , well controlled on metformin.  #Labor:admit to L&D, plan to start IOL with oxytocin; consider AROM when she has a good contraction pattern #Pain: Family support, IV fentanyl, epidural when ready  #FWB: Cat 1 #ID:  GBS negative #MOF: both #MOC:ppIUD - may need to get outpatient due to insurance #Circ:  Yes  Sheppard Evens MD MPH OB  Fellow, Faculty Practice [facility], [facility] 02/09/2023

## 2023-02-09 NOTE — Anesthesia Procedure Notes (Signed)
Epidural Patient location during procedure: OB Start time: 02/09/2023 4:11 AM End time: 02/09/2023 4:21 AM  Staffing Anesthesiologist: Lannie Fields, DO Performed: anesthesiologist   Preanesthetic Checklist Completed: patient identified, IV checked, risks and benefits discussed, monitors and equipment checked, pre-op evaluation and timeout performed  Epidural Patient position: sitting Prep: DuraPrep and site prepped and draped Patient monitoring: continuous pulse ox, blood pressure, heart rate and cardiac monitor Approach: midline Location: L3-L4 Injection technique: LOR air  Needle:  Needle type: Tuohy  Needle gauge: 17 G Needle length: 9 cm Needle insertion depth: 7 cm Catheter type: closed end flexible Catheter size: 19 Gauge Catheter at skin depth: 12 cm Test dose: negative  Assessment Sensory level: T8 Events: blood not aspirated, no cerebrospinal fluid, injection not painful, no injection resistance, no paresthesia and negative IV test  Additional Notes Patient identified. Risks/Benefits/Options discussed with patient including but not limited to bleeding, infection, nerve damage, paralysis, failed block, incomplete pain control, headache, blood pressure changes, nausea, vomiting, reactions to medication both or allergic, itching and postpartum back pain. Confirmed with bedside nurse the patient's most recent platelet count. Confirmed with patient that they are not currently taking any anticoagulation, have any bleeding history or any family history of bleeding disorders. Patient expressed understanding and wished to proceed. All questions were answered. Sterile technique was used throughout the entire procedure. Please see nursing notes for vital signs. Test dose was given through epidural catheter and negative prior to continuing to dose epidural or start infusion. Warning signs of high block given to the patient including shortness of breath, tingling/numbness in  hands, complete motor block, or any concerning symptoms with instructions to call for help. Patient was given instructions on fall risk and not to get out of bed. All questions and concerns addressed with instructions to call with any issues or inadequate analgesia.  Reason for block:procedure for pain

## 2023-02-09 NOTE — Discharge Summary (Signed)
Postpartum Discharge Summary  Date of Service updated***     Patient Name: Lori Donaldson DOB: 08-Nov-1985 MRN: [id number]  Date of admission: 02/09/2023 Delivery date:02/09/2023 Delivering provider: Myna Hidalgo Date of discharge: 02/09/2023  Admitting diagnosis: Gestational diabetes [O24.419] Intrauterine pregnancy: [redacted]w[redacted]d     Secondary diagnosis:  Principal Problem:   Gestational diabetes Active Problems:   Supervision of high risk pregnancy, antepartum   AMA (advanced maternal age) multigravida 35+  Additional problems: ***    Discharge diagnosis: {DX.:23714}                                              Post partum procedures:{Postpartum procedures:23558} Augmentation: AROM and Pitocin Complications: None  Hospital course: Induction of Labor With Vaginal Delivery   37 y.o. yo G3P2002 at [redacted]w[redacted]d was admitted to the hospital 02/09/2023 for induction of labor.  Indication for induction: A2 DM.  Patient had an labor course that was uncomplicated Membrane Rupture Time/Date: 8:37 AM,02/09/2023  Delivery Method:Vaginal, Spontaneous Operative Delivery:N/A Episiotomy: None Lacerations:  None Details of delivery can be found in separate delivery note.  Patient had a postpartum course complicated by***. Patient is discharged home 02/09/23.  Newborn Data: Birth date:02/09/2023 Birth time:11:32 AM Gender:Female Living status:Living Apgars:9 ,9  Weight:   Magnesium Sulfate received: {Mag received:30440022} BMZ received: No Rhophylac:No MMR:No T-DaP:last given 08/2021 Flu: N/A Transfusion:No  Physical exam  Vitals:   02/09/23 0902 02/09/23 0931 02/09/23 1001 02/09/23 1031  BP: (!) 94/46 (!) 97/55 122/69 106/62  Pulse: 90 80 88 78  Resp: 16 16  16   Temp:      TempSrc:      SpO2:      Weight:      Height:       General: {Exam; general:21111117} Lochia: {Desc; appropriate/inappropriate:30686::"appropriate"} Uterine Fundus: {Desc; firm/soft:30687} Incision: {Exam;  incision:21111123} DVT Evaluation: {Exam; dvt:2111122} Labs: Lab Results  Component Value Date   WBC 8.8 02/09/2023   HGB 10.7 (L) 02/09/2023   HCT 33.2 (L) 02/09/2023   MCV 86.2 02/09/2023   PLT 259 02/09/2023      Latest Ref Rng & Units 09/30/2021    1:08 PM  CMP  Glucose 70 - 99 mg/dL 83   BUN 6 - 20 mg/dL 8   Creatinine 4.09 - 8.11 mg/dL 9.14   Sodium 782 - 956 mmol/L 138   Potassium 3.5 - 5.1 mmol/L 3.9   Chloride 98 - 111 mmol/L 107   CO2 22 - 32 mmol/L 21   Calcium 8.9 - 10.3 mg/dL 8.8   Total Protein 6.5 - 8.1 g/dL 6.1   Total Bilirubin 0.3 - 1.2 mg/dL 0.4   Alkaline Phos 38 - 126 U/L 170   AST 15 - 41 U/L 13   ALT 0 - 44 U/L 16    Edinburgh Score:    11/11/2021   10:14 AM  Edinburgh Postnatal Depression Scale Screening Tool  I have been able to laugh and see the funny side of things. 0  I have looked forward with enjoyment to things. 0  I have blamed myself unnecessarily when things went wrong. 2  I have been anxious or worried for no good reason. 1  I have felt scared or panicky for no good reason. 1  Things have been getting on top of me. 1  I have been so unhappy that I have  had difficulty sleeping. 0  I have felt sad or miserable. 1  I have been so unhappy that I have been crying. 1  The thought of harming myself has occurred to me. 0  Edinburgh Postnatal Depression Scale Total 7     After visit meds:  Allergies as of 02/09/2023   No Known Allergies   Med Rec must be completed prior to using this [facility]***        Discharge home in stable condition Infant Feeding: {Baby feeding:23562} Infant Disposition:{CHL IP OB HOME WITH FAOZHY:86578} Discharge instruction: per After Visit Summary and Postpartum booklet. Activity: Advance as tolerated. Pelvic rest for 6 weeks.  Diet: {OB IONG:29528413} Future Appointments:No future appointments. Follow up Visit:   Please schedule this patient for a {Visit type:23955} postpartum visit in {Postpartum  visit:23953} with the following provider: {Provider type:23954}. Additional Postpartum F/U:{PP Procedure:23957}  {Risk level:23960} pregnancy complicated by: {complication:23959} Delivery mode:  Vaginal, Spontaneous Anticipated Birth Control:  IUD outpatient   02/09/2023 Sharon Seller, DO

## 2023-02-09 NOTE — Anesthesia Postprocedure Evaluation (Signed)
Anesthesia Post Note  Patient: Lori Donaldson  Procedure(s) Performed: AN AD HOC LABOR EPIDURAL     Patient location during evaluation: Mother Baby Anesthesia Type: Epidural Level of consciousness: awake Pain management: satisfactory to patient Vital Signs Assessment: post-procedure vital signs reviewed and stable Respiratory status: spontaneous breathing Cardiovascular status: stable Anesthetic complications: no  No notable events documented.  Last Vitals:  Vitals:   02/09/23 1300 02/09/23 1332  BP: 119/73 (!) 109/55  Pulse: 69 97  Resp:  16  Temp:  36.6 C  SpO2:  98%    Last Pain:  Vitals:   02/09/23 1345  TempSrc:   PainSc: 0-No pain   Pain Goal:                   [employer]

## 2023-02-09 NOTE — Anesthesia Preprocedure Evaluation (Signed)
Anesthesia Evaluation  Patient identified by MRN, date of birth, ID band Patient awake    Reviewed: Allergy & Precautions, Patient's Chart, lab work & pertinent test results  Airway Mallampati: II  TM Distance: >3 FB Neck ROM: Full    Dental no notable dental hx.    Pulmonary asthma , former smoker   Pulmonary exam normal breath sounds clear to auscultation       Cardiovascular negative cardio ROS Normal cardiovascular exam Rhythm:Regular Rate:Normal     Neuro/Psych  PSYCHIATRIC DISORDERS Anxiety     negative neurological ROS     GI/Hepatic negative GI ROS, Neg liver ROS,,,  Endo/Other  diabetes, Well Controlled, Gestational  BMI 38  Renal/GU negative Renal ROS  negative genitourinary   Musculoskeletal negative musculoskeletal ROS (+)    Abdominal  (+) + obese  Peds negative pediatric ROS (+)  Hematology  (+) Blood dyscrasia, anemia Hb 10.7, plt 259   Anesthesia Other Findings   Reproductive/Obstetrics (+) Pregnancy                             Anesthesia Physical Anesthesia Plan  ASA: 3  Anesthesia Plan: Epidural   Post-op Pain Management:    Induction:   PONV Risk Score and Plan: 2  Airway Management Planned: Natural Airway  Additional Equipment: None  Intra-op Plan:   Post-operative Plan:   Informed Consent: I have reviewed the patients History and Physical, chart, labs and discussed the procedure including the risks, benefits and alternatives for the proposed anesthesia with the patient or authorized representative who has indicated his/her understanding and acceptance.       Plan Discussed with:   Anesthesia Plan Comments:        Anesthesia Quick Evaluation

## 2023-02-09 NOTE — Social Work (Addendum)
MOB was referred for history of anxiety.  * Referral screened out by Clinical Social Worker because none of the following criteria appear to apply:  ~ History of anxiety/depression during this pregnancy, or of post-partum depression following prior delivery.  ~ Diagnosis of anxiety and/or depression within last 3 years OR * MOB's symptoms currently being treated with medication and/or therapy. Per chart review "Anxiety, stable; has support and therapist if needed".   Please contact the Clinical Social Worker if needs arise, by MOB request.   Wende Neighbors, LCSWA Clinical Social Worker [phone]

## 2023-02-10 MED ORDER — IBUPROFEN 600 MG PO TABS: 600.0000 mg | ORAL_TABLET | Freq: Four times a day (QID) | ORAL | 0 refills | Status: AC

## 2023-02-10 NOTE — Progress Notes (Signed)
Post Partum Day 1 Subjective: no complaints  Objective: Blood pressure 114/72, pulse 71, temperature 98.7 F (37.1 C), temperature source Oral, resp. rate 16, height 5\' 4"  (1.626 m), weight 100.8 kg, SpO2 98%, unknown if currently breastfeeding.  Physical Exam:  General: alert Lochia: appropriate Uterine Fundus: firm Incision: NA DVT Evaluation: No evidence of DVT seen on physical exam.  Recent Labs    02/09/23 0203  HGB 10.7*  HCT 33.2*    Assessment/Plan: Plan for discharge tomorrow   Attending Circumcision Counseling Progress Note  Patient desires circumcision for her female infant.  Circumcision procedure details discussed, risks and benefits of procedure were also discussed.  These include but are not limited to: Benefits of circumcision in men include reduction in the rates of urinary tract infection (UTI), penile cancer, some sexually transmitted infections, penile inflammatory and retractile disorders, as well as easier hygiene.  Risks include bleeding , infection, injury of glans which may lead to penile deformity or urinary tract issues, unsatisfactory cosmetic appearance and other potential complications related to the procedure.  It was emphasized that this is an elective procedure.  Patient wants to proceed with circumcision; written informed consent obtained.  Will do circumcision soon, routine circumcision and post circumcision care ordered for the infant.  Guiliana Shor L. Alysia Penna, M.D. 02/10/2023 7:48 AM      LOS: 1 day   Hermina Staggers, MD 02/10/2023, 7:48 AM

## 2023-02-10 NOTE — Lactation Note (Signed)
This note was copied from a baby's chart. Lactation Consultation Note  Patient Name: Lori Donaldson [id number]'[id number] Date: 02/10/2023 Age:36 hours  LC observed in follow sheet infant's past 3 feedings have been formula  twice and one breastfeeding. Per Birth Parent, her feeding choice is breast and formula feeding, she did both with previous two children and this is her current feeding plan. Infant was circumcised and been sleepy Birth Parent  decided to formula feed infant only with recent feeding,  Infant was finished with  bottle feeding prior to [facility] entering the room. LC discussed to continue to feed infant by cues, on demand, 8 to 12+ times within 24 hours, skin to skin. LC suggested to latch infant first with every feeding to help establish and stimulate her milk supply.  LC discussed infant's input and output and infant had 3 voids and 2 stools. LC discussed importance of maternal rest, diet and hydration. Birth Parent was  aware of O/P services, breastfeeding support groups, community resources, and our phone # for post-discharge questions.    Current Feeding Plan: 1- Birth Parent will latch infant 1st for every feeding, 8 to 12+ times within 24 hours, skin to skin.  2- Birth Parent will supplement infant after latching infant at the breast , this is her feeding choice, hand out given " Supplementing with Breastfeeding".  3- Birth Parent knows to call RN/LC for latch assistance if needed.   Maternal Data    Feeding    LATCH Score                    Lactation Tools Discussed/Used    Interventions    Discharge    Consult Status      Lori Donaldson 02/10/2023, 3:25 PM

## 2023-02-16 ENCOUNTER — Encounter: Payer: Commercial Managed Care - HMO | Admitting: Obstetrics & Gynecology

## 2023-03-06 ENCOUNTER — Telehealth (HOSPITAL_COMMUNITY): Payer: Self-pay

## 2023-03-06 NOTE — Telephone Encounter (Signed)
03/06/2023 1513  Name: Giulietta Lenington MRN: [id number] DOB: July 26, 1985  Reason for Call:  Transition of Care Hospital Discharge Call  Contact Status: Patient Contact Status: Message  Language assistant needed: Interpreter Mode: Interpreter Not Needed        Follow-Up Questions:    Inocente Salles Postnatal Depression Scale:  In the Past 7 Days:    PHQ2-9 Depression Scale:     Discharge Follow-up:    Post-discharge interventions: NA  Signature  Signe Colt

## 2023-03-21 ENCOUNTER — Encounter: Payer: Self-pay | Admitting: Women's Health

## 2023-03-21 ENCOUNTER — Ambulatory Visit (INDEPENDENT_AMBULATORY_CARE_PROVIDER_SITE_OTHER): Payer: Commercial Managed Care - HMO | Admitting: Women's Health

## 2023-03-21 DIAGNOSIS — Z3202 Encounter for pregnancy test, result negative: Secondary | ICD-10-CM

## 2023-03-21 DIAGNOSIS — Z8632 Personal history of gestational diabetes: Secondary | ICD-10-CM | POA: Diagnosis not present

## 2023-03-21 DIAGNOSIS — Z30430 Encounter for insertion of intrauterine contraceptive device: Secondary | ICD-10-CM | POA: Diagnosis not present

## 2023-03-21 LAB — POCT URINE PREGNANCY: Preg Test, Ur: NEGATIVE

## 2023-03-21 MED ORDER — LEVONORGESTREL 20 MCG/DAY IU IUD
1.0000 | INTRAUTERINE_SYSTEM | Freq: Once | INTRAUTERINE | Status: AC
Start: 2023-03-21 — End: 2023-03-21
  Administered 2023-03-21: 1 via INTRAUTERINE

## 2023-03-21 NOTE — Patient Instructions (Signed)
You will have your sugar test next visit.  Please do not eat or drink anything after midnight the night before you come, not even water.  You will be here for at least two hours.  Please make an appointment online for the bloodwork at [email or web address] for 8:30am (or as close to this as possible). Make sure you select the [facility] service center. The day of the appointment, check in with our office first, then you will go to [facility] to start the sugar test.    Nothing in vagina for 3 days (no sex, douching, tampons, etc...) Check your strings once a month to make sure you can feel them, if you are not able to please let us know If you develop a fever of 100.4 or more in the next few weeks, or if you develop severe abdominal pain, please let [place] know Use a backup method of birth control, such as condoms, for 2 weeks   Intrauterine Device Information An intrauterine device (IUD) is a medical device that is inserted into the uterus to prevent pregnancy. It is a small, T-shaped device that has one or two nylon strings hanging down from it. The strings hang out of the lower part of the uterus (cervix) to allow for future IUD removal. There are two types of IUDs: Hormone IUD. This type of IUD is made of plastic and contains the hormone progestin (synthetic progesterone). A hormone IUD may last 3-5 years. Copper IUD. This type of IUD has copper wire wrapped around it. A copper IUD may last up to 10 years. How is an IUD inserted? An IUD is inserted through the vagina, through the cervix, and into the uterus with a minor medical procedure. The procedure for IUD insertion may vary among health care providers and hospitals. How does an IUD work? Synthetic progesterone in a hormonal IUD prevents pregnancy by: Thickening cervical mucus to prevent sperm from entering the uterus. Thinning the uterine lining to prevent a fertilized egg from being implanted there. Copper in a copper IUD prevents pregnancy by making the  uterus and fallopian tubes produce a fluid that kills sperm. What are the advantages of an IUD? Advantages of either type of IUD An IUD: Is highly effective in preventing pregnancy. Is reversible. You can become pregnant shortly after the IUD is removed. Is low-maintenance and can stay in place for a long time. Has no estrogen-related side effects. Can be used when breastfeeding. Is not associated with weight gain. Can be inserted right after childbirth, an abortion, or a miscarriage. Advantages of a hormone IUD If it is inserted within 7 days of your period starting, it works right after it has been inserted. If the hormone IUD is inserted at any other time in your cycle, you will need to use a backup method of birth control for 7 days after insertion. It can make menstrual periods lighter or stop completely. It can reduce menstrual cramping and other discomforts from menstrual periods. It can be used for 3-5 years, depending on which IUD you have. Advantages of a copper IUD It works right after it is inserted. It can be used as a form of emergency birth control if it is inserted within 5 days after having unprotected sex. It does not interfere with your body's natural hormones. It can be used for up to 10 years. What are the disadvantages of an IUD? An IUD may cause irregular menstrual bleeding for a period of time after insertion. It is common to have  pain during insertion and have cramping and vaginal bleeding after insertion. An IUD may cut the uterus (uterine perforation) when it is inserted. This is rare. Pelvic inflammatory disease (PID) may happen after insertion of an IUD. PID is an infection in the uterus and fallopian tubes. The IUD does not cause the infection. The infection is usually from an unknown sexually transmitted infection (STI). This is rare, and it usually happens during the first 20 days after the IUD is inserted. A copper IUD can make your menstrual flow heavier  and more painful. IUDs cannot prevent sexually transmitted infections (STIs). How is an IUD removed?  You will lie on your back with your knees bent and your feet in footrests (stirrups). A device will be inserted into your vagina to spread apart the vaginal walls (speculum). This will allow your health care provider to see the strings attached to the IUD. Your health care provider will use a small instrument (forceps) to grasp the IUD strings and will pull firmly until the IUD is removed. You may have some discomfort when the IUD is removed. Your health care provider may recommend taking over-the-counter pain relievers, such as ibuprofen, before the procedure. You may also have minor spotting for a few days after the procedure. The procedure for IUD removal may vary among health care providers and hospitals. Is an IUD right for me? If you are interested in an IUD, discuss it with your health care provider. He or she will make sure you are a good candidate for an IUD and will let you know more about the advantages, disadvantage, and possible side effects. This will allow you to make a decision about the device. Summary An intrauterine device (IUD) is a medical device that is inserted in the uterus to prevent pregnancy. It is a small, T-shaped device that has one or two nylon strings hanging down from it. A hormone IUD contains the hormone progestin (synthetic progesterone). A copper IUD has copper wire wrapped around it. Synthetic progesterone in a hormone IUD prevents pregnancy by thickening cervical mucus and thinning the walls of the uterus. Copper in a copper IUD prevents pregnancy by making the uterus and fallopian tubes produce a fluid that kills sperm. A hormone IUD can be left in place for 3-5 years. A copper IUD can be left in place for up to 10 years. An IUD is inserted and removed by a health care provider. You may feel some pain during insertion and removal. Your health care provider may  recommend taking over-the-counter pain medicine, such as ibuprofen, before an IUD procedure. This information is not intended to replace advice given to you by your health care provider. Make sure you discuss any questions you have with your health care provider. Document Revised: 01/07/2020 Document Reviewed: 01/07/2020 Elsevier Patient Education  2024 [employer].

## 2023-03-21 NOTE — Progress Notes (Signed)
POSTPARTUM VISIT Patient name: Lori Donaldson MRN [id number]  Date of birth: 1986-05-08 Chief Complaint:   Postpartum Care (Right nipple concern. Would like it looked at. Size is larger than left. )  History of Present Illness:   Lori Donaldson is a 37 y.o. G15P3003 Hispanic female being seen today for a postpartum visit. She is 5 weeks postpartum following a spontaneous vaginal delivery at 39.0 gestational weeks. IOL: yes, for diabetes mellitus A2DM . Anesthesia: epidural.  Laceration: none.  Complications: none. Inpatient contraception: no.   Pregnancy complicated by A2DM . Tobacco use: no. Substance use disorder: no. Last pap smear: 11/11/21 and results were NILM w/ HRHPV negative. Next pap smear due: 2026 No LMP recorded (lmp unknown).  Postpartum course has been uncomplicated. Rt nipple looks a little bigger and hurts some w/ latching or w/ pump. Bleeding none. Bowel function is normal. Bladder function is normal. Urinary incontinence? no, fecal incontinence? no Patient is not sexually active. Last sexual activity: prior to birth of baby. Desired contraception: IUD. Patient does want a pregnancy in the future.  Desired family size is 4 children.   Upstream - 03/21/23 1039       Pregnancy Intention Screening   Does the patient want to become pregnant in the next year? No    Does the patient's partner want to become pregnant in the next year? No    Would the patient like to discuss contraceptive options today? Yes      Contraception Wrap Up   Current Method No Contraceptive Precautions    End Method IUD or IUS    Contraception Counseling Provided Yes            The pregnancy intention screening data noted above was reviewed. Potential methods of contraception were discussed. The patient elected to proceed with IUD or IUS.  Edinburgh Postpartum Depression Screening: negative, some anxiety, but copes well  Edinburgh Postnatal Depression Scale - 03/21/23 1039        Edinburgh Postnatal Depression Scale:  In the Past 7 Days   I have been able to laugh and see the funny side of things. 0    I have looked forward with enjoyment to things. 1    I have blamed myself unnecessarily when things went wrong. 2    I have been anxious or worried for no good reason. 1    I have felt scared or panicky for no good reason. 1    Things have been getting on top of me. 1    I have been so unhappy that I have had difficulty sleeping. 1    I have felt sad or miserable. 1    I have been so unhappy that I have been crying. 0    The thought of harming myself has occurred to me. 0    Edinburgh Postnatal Depression Scale Total 8                08/11/2022   11:38 AM 04/20/2021    2:45 PM 03/01/2021   10:41 AM  GAD 7 : Generalized Anxiety Score  Nervous, Anxious, on Edge 0 1 0  Control/stop worrying 0 0 0  Worry too much - different things 0 0 0  Trouble relaxing 0 0 0  Restless 0 0 0  Easily annoyed or irritable 1 1 1   Afraid - awful might happen 0 0 0  Total GAD 7 Score 1 2 1      Baby's course has been  uncomplicated. Baby is feeding by breast and bottle: milk supply adequate. Infant has a pediatrician/family doctor? Yes.  Childcare strategy if returning to work/school: family.  Pt has material needs met for her and baby: Yes.   Review of Systems:   Pertinent items are noted in HPI Denies Abnormal vaginal discharge w/ itching/odor/irritation, headaches, visual changes, shortness of breath, chest pain, abdominal pain, severe nausea/vomiting, or problems with urination or bowel movements. Pertinent History Reviewed:  Reviewed past medical,surgical, obstetrical and family history.  Reviewed problem list, medications and allergies. OB History  Gravida Para Term Preterm AB Living  3 3 3     3   SAB IAB Ectopic Multiple Live Births        0 3    # Outcome Date GA Lbr Len/2nd Weight Sex Type Anes PTL Lv  3 Term 02/09/23 [redacted]w[redacted]d 09:29 / 00:03 7 lb 1.6 oz (3.22 kg) M  Vag-Spont EPI  LIV  2 Term 09/30/21 [redacted]w[redacted]d 20:17 / 00:42 6 lb 9.1 oz (2.98 kg) M Vag-Spont EPI N LIV  1 Term 01/07/18 [redacted]w[redacted]d 20:00 / 00:28 5 lb 15.4 oz (2.705 kg) F Vag-Spont EPI N LIV   Physical Assessment:   Vitals:   03/21/23 1036  BP: 112/78  Pulse: 69  Weight: 199 lb 8 oz (90.5 kg)  Height: 5\' 4"  (1.626 m)  Body mass index is 34.24 kg/m.       Physical Examination:   General appearance: alert, well appearing, and in no distress  Mental status: alert, oriented to person, place, and time  Skin: warm & dry   Cardiovascular: normal heart rate noted   Respiratory: normal respiratory effort, no distress   Breasts: Rt nipple slightly larger, a little pink  Abdomen: soft, non-tender   Pelvic: VULVA: normal appearing vulva with no masses, tenderness or lesions, VAGINA: normal appearing vagina with normal color and discharge, no lesions, CERVIX: normal appearing cervix without discharge or lesions. Thin prep pap obtained: No  Rectal: not examined  Extremities: Edema: none        Results for orders placed or performed in visit on 03/21/23 (from the past 24 hour(s))  POCT urine pregnancy   Collection Time: 03/21/23 10:58 AM  Result Value Ref Range   Preg Test, Ur Negative Negative     IUD INSERTION The risks and benefits of the method and placement have been thouroughly reviewed with the patient and all questions were answered.  Specifically the patient is aware of failure rate of 07/998, expulsion of the IUD and of possible perforation.  The patient is aware of irregular bleeding due to the method and understands the incidence of irregular bleeding diminishes with time.  Signed copy of informed consent in chart.   Time out was performed.  A graves speculum was placed in the vagina.  The cervix was visualized, prepped using Betadine, and grasped with a single tooth tenaculum. The uterus was found to be anteroflexed and it sounded to 8 cm.  Mirena  IUD placed per manufacturer's  recommendations. The strings were trimmed to approximately 3 cm. The patient tolerated the procedure well.   Informal transvaginal sonogram was performed and the proper placement of the IUD was verified.  Chaperone: Freddie Apley    Assessment & Plan:  1) Postpartum exam 2) 5 wks s/p spontaneous vaginal delivery after IOL for A2DM 3) breast & bottle feeding 4) Depression screening 5) Mirena IUD insertion The patient was given post procedure instructions, including signs and symptoms of infection and to  check for the strings after each menses or each month, and refraining from intercourse or anything in the vagina for 3 days.  Condoms for 2 weeks. She was given a care card with date IUD placed, and date IUD to be removed.  She is scheduled for a f/u appointment in 4 weeks. 6) Rt nipple painful and slightly larger> rx APNO  Essential components of care per ACOG recommendations:  1.  Mood and well being:  If positive depression screen, discussed and plan developed.  If using tobacco we discussed reduction/cessation and risk of relapse If current substance abuse, we discussed and referral to local resources was offered.   2. Infant care and feeding:  If breastfeeding, discussed returning to work, pumping, breastfeeding-associated pain, guidance regarding return to fertility while lactating if not using another method. If needed, patient was provided with a letter to be allowed to pump q 2-3hrs to support lactation in a private location with access to a refrigerator to store breastmilk.   Recommended that all caregivers be immunized for flu, pertussis and other preventable communicable diseases If pt does not have material needs met for her/baby, referred to local resources for help obtaining these.  3. Sexuality, contraception and birth spacing Provided guidance regarding sexuality, management of dyspareunia, and resumption of intercourse Discussed avoiding interpregnancy interval <56mths  and recommended birth spacing of 18 months  4. Sleep and fatigue Discussed coping options for fatigue and sleep disruption Encouraged family/partner/community support of 4 hrs of uninterrupted sleep to help with mood and fatigue  5. Physical recovery  If pt had a C/S, assessed incisional pain and providing guidance on normal vs prolonged recovery If pt had a laceration, perineal healing and pain reviewed.  If urinary or fecal incontinence, discussed management and referred to PT or uro/gyn if indicated  Patient is safe to resume physical activity. Discussed attainment of healthy weight.  6.  Chronic disease management Discussed pregnancy complications if any, and their implications for future childbearing and long-term maternal health. Review recommendations for prevention of recurrent pregnancy complications, such as 17 hydroxyprogesterone caproate to reduce risk for recurrent PTB not applicable, or aspirin to reduce risk of preeclampsia not applicable. Pt had GDM: Yes. If yes, 2hr GTT scheduled: yes. Reviewed medications and non-pregnant dosing including consideration of whether pt is breastfeeding using a reliable resource such as LactMed: yes Referred for f/u w/ PCP or subspecialist providers as indicated: not applicable  7. Health maintenance Mammogram at 36yo or earlier if indicated Pap smears as indicated  Meds: No orders of the defined types were placed in this encounter.   Follow-up: Return in about 4 weeks (around 04/18/2023) for IUD f/u, 2hr postpartum sugar test, CNM, in person.   Orders Placed This Encounter  Procedures   POCT urine pregnancy    Cheral Marker CNM, [facility] 03/21/2023 11:12 AM

## 2023-03-21 NOTE — Addendum Note (Signed)
Addended by: Freddie Apley R on: 03/21/2023 11:37 AM   Modules accepted: Orders

## 2023-04-20 ENCOUNTER — Encounter: Payer: Self-pay | Admitting: Advanced Practice Midwife

## 2023-04-20 ENCOUNTER — Other Ambulatory Visit: Payer: Managed Care, Other (non HMO)

## 2023-04-20 ENCOUNTER — Ambulatory Visit: Payer: Managed Care, Other (non HMO) | Admitting: Advanced Practice Midwife

## 2023-04-20 VITALS — BP 112/76 | HR 56 | Ht 64.0 in | Wt 204.0 lb

## 2023-04-20 DIAGNOSIS — Z131 Encounter for screening for diabetes mellitus: Secondary | ICD-10-CM

## 2023-04-20 DIAGNOSIS — Z30431 Encounter for routine checking of intrauterine contraceptive device: Secondary | ICD-10-CM | POA: Diagnosis not present

## 2023-04-20 DIAGNOSIS — Z8632 Personal history of gestational diabetes: Secondary | ICD-10-CM

## 2023-04-20 NOTE — Progress Notes (Signed)
   GYN VISIT Patient name: Lori Donaldson MRN [id number]  Date of birth: [birth year] Chief Complaint:   Postpartum Care (2 HR GTT and check IUD)  History of Present Illness:   Lori Donaldson is a 37 y.o. G72P3003 Hispanic female being seen today for IUD check and 2h GTT postpartum. Has been breast and bottlefeeding; doing pretty well adjusting to 3 kiddos.    No LMP recorded (lmp unknown). The current method of family planning is IUD.  Last pap May 2023. Results were: NILM w/ HRHPV negative     08/11/2022   11:37 AM 04/20/2021    2:45 PM 03/01/2021   10:41 AM  Depression screen PHQ 2/9  Decreased Interest 1 0 0  Down, Depressed, Hopeless 0 0 0  PHQ - 2 Score 1 0 0  Altered sleeping 1 0 0  Tired, decreased energy 2 1 1   Change in appetite 0 0 0  Feeling bad or failure about yourself  1 1 0  Trouble concentrating 0 0 0  Moving slowly or fidgety/restless 0 0 0  Suicidal thoughts 0 0 0  PHQ-9 Score 5 2 1         08/11/2022   11:38 AM 04/20/2021    2:45 PM 03/01/2021   10:41 AM  GAD 7 : Generalized Anxiety Score  Nervous, Anxious, on Edge 0 1 0  Control/stop worrying 0 0 0  Worry too much - different things 0 0 0  Trouble relaxing 0 0 0  Restless 0 0 0  Easily annoyed or irritable 1 1 1   Afraid - awful might happen 0 0 0  Total GAD 7 Score 1 2 1      Review of Systems:   Pertinent items are noted in HPI Denies fever/chills, dizziness, headaches, visual disturbances, fatigue, shortness of breath, chest pain, abdominal pain, vomiting, abnormal vaginal discharge/itching/odor/irritation, problems with periods, bowel movements, urination, or intercourse unless otherwise stated above.  Pertinent History Reviewed:  Reviewed past medical,surgical, social, obstetrical and family history.  Reviewed problem list, medications and allergies. Physical Assessment:   Vitals:   04/20/23 0846  BP: 112/76  Pulse: (!) 56  Weight: 204 lb (92.5 kg)  Height: 5\' 4"  (1.626 m)  Body  mass index is 35.02 kg/m.       Physical Examination:   General appearance: alert, well appearing, and in no distress  Mental status: alert, oriented to person, place, and time  Skin: warm & dry   Cardiovascular: normal heart rate noted  Respiratory: normal respiratory effort, no distress  Abdomen: soft, non-tender   Pelvic: normal external genitalia, vulva, vagina, cervix, uterus and adnexa; IUD strings approx 2-3cm from os  Extremities: no edema    No results found for this or any previous visit (from the past 24 hour(s)).  Assessment & Plan:  1) IUD check> strings visible; has spotting but no other concerns  2) s/p GDMA2> 2h GTT today; has PCP (Dr Riley Nearing)  Meds: No orders of the defined types were placed in this encounter.   No orders of the defined types were placed in this encounter.   Return for prn.  Arabella Merles CNM 04/20/2023 9:52 AM

## 2023-04-21 LAB — GLUCOSE TOLERANCE, 2 HOURS W/ 1HR
Glucose, 1 hour: 90 mg/dL (ref 70–179)
Glucose, 2 hour: 75 mg/dL (ref 70–152)
Glucose, Fasting: 81 mg/dL (ref 70–91)
